# Patient Record
Sex: Female | Born: 1980
Health system: Southern US, Community
[De-identification: ages and names within clinical notes are randomized; demographics above are authoritative.]

## PROBLEM LIST (undated history)

## (undated) DIAGNOSIS — J45909 Unspecified asthma, uncomplicated: Secondary | ICD-10-CM

## (undated) DIAGNOSIS — Z789 Other specified health status: Secondary | ICD-10-CM

## (undated) DIAGNOSIS — K219 Gastro-esophageal reflux disease without esophagitis: Secondary | ICD-10-CM

## (undated) DIAGNOSIS — F419 Anxiety disorder, unspecified: Secondary | ICD-10-CM

## (undated) DIAGNOSIS — F909 Attention-deficit hyperactivity disorder, unspecified type: Secondary | ICD-10-CM

## (undated) DIAGNOSIS — G43909 Migraine, unspecified, not intractable, without status migrainosus: Secondary | ICD-10-CM

## (undated) DIAGNOSIS — D649 Anemia, unspecified: Secondary | ICD-10-CM

## (undated) DIAGNOSIS — N390 Urinary tract infection, site not specified: Secondary | ICD-10-CM

## (undated) HISTORY — DX: Unspecified asthma, uncomplicated: J45.909

## (undated) HISTORY — DX: Attention-deficit hyperactivity disorder, unspecified type: F90.9

## (undated) HISTORY — DX: Anxiety disorder, unspecified: F41.9

## (undated) HISTORY — DX: Anemia, unspecified: D64.9

## (undated) HISTORY — PX: INTRAUTERINE DEVICE INSERTION: SHX323

## (undated) HISTORY — DX: Migraine, unspecified, not intractable, without status migrainosus: G43.909

## (undated) HISTORY — DX: Gastro-esophageal reflux disease without esophagitis: K21.9

## (undated) HISTORY — DX: Urinary tract infection, site not specified: N39.0

---

## 2007-11-06 ENCOUNTER — Inpatient Hospital Stay (HOSPITAL_COMMUNITY): Admission: AD | Admit: 2007-11-06 | Discharge: 2007-11-10 | Payer: Self-pay | Admitting: Obstetrics and Gynecology

## 2010-03-08 ENCOUNTER — Inpatient Hospital Stay (HOSPITAL_COMMUNITY)
Admission: AD | Admit: 2010-03-08 | Discharge: 2010-03-08 | Disposition: A | Discharge status: Home / Self Care | Payer: BC Managed Care – PPO | Source: Ambulatory Visit | Attending: Obstetrics and Gynecology | Admitting: Obstetrics and Gynecology

## 2010-03-08 DIAGNOSIS — O210 Mild hyperemesis gravidarum: Secondary | ICD-10-CM | POA: Insufficient documentation

## 2010-03-08 LAB — ABO/RH: RH Type: POSITIVE

## 2010-03-08 LAB — ANTIBODY SCREEN: Antibody Screen: NEGATIVE

## 2010-03-08 LAB — RPR: RPR: NONREACTIVE

## 2010-03-08 LAB — HIV ANTIBODY (ROUTINE TESTING W REFLEX): HIV: NONREACTIVE

## 2010-03-08 LAB — RUBELLA ANTIBODY, IGM: Rubella: IMMUNE

## 2010-03-08 LAB — HEPATITIS B SURFACE ANTIGEN: Hepatitis B Surface Ag: NEGATIVE

## 2010-06-01 NOTE — Op Note (Signed)
NAMESEVERINA, Gina Cantrell         ACCOUNT NO.:  [id number]   MEDICAL RECORD NO.:  [id number]          PATIENT TYPE:  INP   LOCATION:  9123                          FACILITY:  [facility]   PHYSICIAN:  Michelle L. Grewal, M.D.DATE OF BIRTH:  July 04, 1980   DATE OF PROCEDURE:  11/07/2007  DATE OF DISCHARGE:                               OPERATIVE REPORT   PREOPERATIVE DIAGNOSES:  Intrauterine pregnancy at 39-6/7, large for  gestational age, and nonreassuring fetal heart rate.   POSTOPERATIVE DIAGNOSES:  Intrauterine pregnancy at 39-6/7, large for  gestational age, and nonreassuring fetal heart rate.   PROCEDURE:  Primary low transverse cesarean section.   SURGEON:  Michelle L. Vincente Poli, MD   ANESTHESIA:  Epidural.   FINDINGS:  Female infant, Apgars 8 at 1 and 9 at 5 minutes.   ESTIMATED BLOOD LOSS:  500 mL.   COMPLICATIONS:  None.   PROCEDURE IN DETAIL:  The patient was taken to the operating room from  labor and delivery room #168.  She was prepped and draped.  Foley  catheter had been inserted while on labor and delivery.  A low  transverse incision was made, carried down to the fascia, fascia scored  in the midline, and extended laterally.  Rectus muscles were separated  in the midline.  The peritoneum was entered bluntly.  The peritoneal  incision was then stretched.  The bladder blade was inserted.  The lower  uterine segment was identified and the bladder flap was created sharply  and then digitally.  The bladder blade was readjusted.  A low transverse  incision was made in the uterus.  The baby was noted to be in OA  position, but the head was hyperextended.  I think this is why the baby  was not coming down.  A vacuum extractor was placed in the proper place,  and the baby was delivered easily with 2 gentle pulls.  The baby was a  female infant, Apgars 8 at 1 minute and 9 at 5 minutes.  The baby was  DeLee on the abdomen, but the fluid was clear.  After cord was clamped  and  cut, the baby was handed to the Awaiting Neonatal Team.  The cord pH  were unable to obtain and a cord pH with the placenta was manually  removed and noted to be normal intact with a three-vessel cord.  The  uterus was exteriorized.  It was cleared of all clots and debris.  The  ovaries appeared normal.  This uterine incision was closed in 1 layer  using 0 chromic in a running locked stitch.  The uterus was returned to  the abdomen.  Irrigation was performed.  Rectus muscles and peritoneum  were reapproximated using 0 Vicryl.  The  fascia was closed using 0 Vicryl in a running stitch.  After irrigation  of subcutaneous layer, the skin was closed with subcuticular using 3-0  Vicryl and then closed with Dermabond skin adhesive.  All sponge, laps,  and instrument counts were correct x2.  The patient went to recovery  room in stable condition.      Michelle L. Vincente Poli, M.D.  Electronically Signed     MLG/MEDQ  D:  11/07/2007  T:  11/08/2007  Job:  [id number]

## 2010-06-04 NOTE — Discharge Summary (Signed)
Gina Cantrell, Gina Cantrell         ACCOUNT NO.:  [id number]   MEDICAL RECORD NO.:  [id number]          PATIENT TYPE:  INP   LOCATION:  9123                          FACILITY:  [facility]   PHYSICIAN:  Dineen Kid. Rana Snare, M.D.    DATE OF BIRTH:  June 24, 1980   DATE OF ADMISSION:  11/06/2007  DATE OF DISCHARGE:  11/10/2007                               DISCHARGE SUMMARY   ADMITTING DIAGNOSES:  1. Intrauterine pregnancy 39 and 6/7th weeks' estimated gestational      age.  2. Induction of labor secondary to large for gestational age infant.   DISCHARGE DIAGNOSES:  1. Status post low transverse cesarean section secondary to      nonreassuring fetal heart tones.  2. A viable female infant.   PROCEDURE:  Primary low transverse cesarean section.   REASON FOR ADMISSION:  Please see written H&P.   HOSPITAL COURSE:  The patient is a 30 year old gravida 2, para 0, that  was admitted to [facility] at 60 and 6/7th weeks'  estimated gestational age for induction of labor.  The patient had known  large for gestational age infant with estimated fetal weight of 8.5  pounds, which placed the patient at 80th percentile for estimated fetal  weight.  The patient was now admitted for induction of labor.  Prenatal  care had been uncomplicated.  Group B beta strep culture was negative.  On admission, vital signs were stable.  She was afebrile.  Fetal heart  tones were reactive.  Cervix was examined and found to be 1.5 cm  dilated, 80% effaced, vertex at a zero station.  Artificial rupture of  membranes was performed, which revealed some watery dark meconium-  stained fluid.  The patient was started on amnioinfusion and epidural  was placed for comfort.  Shortly, thereafter the fetal heart tones were  noted to have repetitive decelerations, which appeared to be late in  appearance.  Decision was made then to proceed with a primary low  transverse cesarean section for nonreassuring fetal heart tones  and  failure to progress.  The patient was then transferred to the operating  room where epidural was dosed to an adequate surgical level.  A low  transverse incision was made with delivery of a viable female infant  weighing 6 pounds 7 ounces with Apgars of 8 at one minute and 9 at five  minutes.  The patient tolerated the procedure well and was taken to the  recovery room in stable condition.  On postoperative day #1, the patient  was without complaint other than some incisional soreness.  Vital signs  were stable.  She was afebrile.  Abdomen was soft with good return of  bowel function.  Fundus was firm, slightly tender to palpation.  Incision was noted to be clean, dry, and intact with subcuticular  closure, some ecchymosis was noted superior and inferior to the  incisional site.  Laboratory findings showed a hemoglobin of 10.2,  platelet count 180,000, WBC of 10.8.  On postoperative day #2, the  patient continued to have some incisional soreness.  Vital signs were  otherwise stable.  Abdomen was soft.  Fundus was firm, nontender.  Incision was clean, dry, and intact and ecchymosis continued to be  superior and inferior to the incisional site, however stable.  On  postoperative day #3, the patient was without complaint.  Vital signs  remained stable.  She was afebrile.  Fundus was firm, nontender.  Incision was clean, dry, and intact.  Ecchymosis was without change.   DISCHARGE INSTRUCTIONS:  Reviewed and the patient was later discharged  home.   CONDITION ON DISCHARGE:  Stable.   DIET:  Regular as tolerated.   ACTIVITY:  No heavy lifting, no driving x2 weeks, and no vaginal entry.   FOLLOWUP:  The patient is to follow up in the office in 1-2 weeks for an  incision check.  She is to call for temperature greater than 100  degrees, persistent nausea, vomiting, heavy vaginal bleeding and/or  redness, or drainage from the incisional site.   DISCHARGE MEDICATIONS:  1. Tylox #30, 1  p.o. for every 4-6 hours p.r.n.  2. Motrin 600 mg every 6 hours.  3. Prenatal vitamins 1 p.o. daily.  4. Colace 1 p.o. daily p.r.n.      Julio Sicks, N.P.      Dineen Kid Rana Snare, M.D.  Electronically Signed    CC/MEDQ  D:  12/10/2007  T:  12/10/2007  Job:  [id number]

## 2010-10-13 ENCOUNTER — Encounter (HOSPITAL_COMMUNITY)
Admission: RE | Admit: 2010-10-13 | Discharge: 2010-10-13 | Disposition: A | Discharge status: Home / Self Care | Payer: BC Managed Care – PPO | Source: Ambulatory Visit | Attending: Obstetrics and Gynecology | Admitting: Obstetrics and Gynecology

## 2010-10-13 ENCOUNTER — Encounter (HOSPITAL_COMMUNITY): Payer: Self-pay

## 2010-10-13 HISTORY — DX: Other specified health status: Z78.9

## 2010-10-13 LAB — CBC
HCT: 32 % — ABNORMAL LOW (ref 36.0–46.0)
Hemoglobin: 10.6 g/dL — ABNORMAL LOW (ref 12.0–15.0)
MCH: 28.1 pg (ref 26.0–34.0)
MCHC: 33.1 g/dL (ref 30.0–36.0)
MCV: 84.9 fL (ref 78.0–100.0)
Platelets: 208 10*3/uL (ref 150–400)
RBC: 3.77 MIL/uL — ABNORMAL LOW (ref 3.87–5.11)
RDW: 12.6 % (ref 11.5–15.5)
WBC: 7.7 10*3/uL (ref 4.0–10.5)

## 2010-10-13 LAB — RPR: RPR Ser Ql: NONREACTIVE

## 2010-10-13 LAB — SURGICAL PCR SCREEN
MRSA, PCR: NEGATIVE
Staphylococcus aureus: NEGATIVE

## 2010-10-13 NOTE — Patient Instructions (Signed)
   Your procedure is scheduled on:10/18/10- Monday  Enter through the Main Entrance of [facility] at:0600 Pick up the phone at the desk and dial [phone] and inform us of your arrival  Please call this number if you have any problems the morning of surgery: [phone]  Remember: Do not eat food after midnight  Do not drink clear liquids after:midnight Take these medicines the morning of surgery with a SIP OF WATER:none  Do not wear jewelry, make-up, or FINGER nail polish Do not wear lotions, powders, or perfumes.  You may wear deodorant. Do not shave 48 hours prior to surgery. Do not bring valuables to the hospital. Leave suitcase in the car. After Surgery it may be brought to your room. For patients being admitted to the hospital, checkout time is 11:00am the day of discharge.  Patients discharged on the day of surgery will not be allowed to drive home.   Name and phone number of your driver:   Remember to use your hibiclens as instructed.Please shower with 1/2 bottle the evening before your surgery and the other 1/2 bottle the morning of surgery.

## 2010-10-17 NOTE — H&P (Signed)
30 year old G 2 P 2 at 40 weeks and 1 day admitted for Repeat c section on October 1. She has had 1 previous c section.  pnc see hollister  gbbs is negative  AFVSS Gen alert and oriented Lung CTAB Car RRR Abd gravid Cervix is closed No edema  IMP IUP at term Previous c section  PLAN Repeat C Section

## 2010-10-18 ENCOUNTER — Encounter (HOSPITAL_COMMUNITY)
Admission: RE | Disposition: A | Discharge status: Home / Self Care | Payer: Self-pay | Source: Ambulatory Visit | Attending: Obstetrics and Gynecology

## 2010-10-18 ENCOUNTER — Encounter (HOSPITAL_COMMUNITY): Payer: Self-pay | Admitting: *Deleted

## 2010-10-18 ENCOUNTER — Encounter (HOSPITAL_COMMUNITY): Payer: Self-pay | Admitting: Anesthesiology

## 2010-10-18 ENCOUNTER — Inpatient Hospital Stay (HOSPITAL_COMMUNITY)
Admission: RE | Admit: 2010-10-18 | Discharge: 2010-10-21 | DRG: 371 | Disposition: A | Discharge status: Home / Self Care | Payer: BC Managed Care – PPO | Source: Ambulatory Visit | Attending: Obstetrics and Gynecology | Admitting: Obstetrics and Gynecology

## 2010-10-18 ENCOUNTER — Inpatient Hospital Stay (HOSPITAL_COMMUNITY): Payer: BC Managed Care – PPO | Admitting: Anesthesiology

## 2010-10-18 DIAGNOSIS — Z01812 Encounter for preprocedural laboratory examination: Secondary | ICD-10-CM

## 2010-10-18 DIAGNOSIS — Z98891 History of uterine scar from previous surgery: Secondary | ICD-10-CM

## 2010-10-18 DIAGNOSIS — Z01818 Encounter for other preprocedural examination: Secondary | ICD-10-CM

## 2010-10-18 DIAGNOSIS — O34219 Maternal care for unspecified type scar from previous cesarean delivery: Principal | ICD-10-CM | POA: Diagnosis present

## 2010-10-18 SURGERY — Surgical Case
Anesthesia: Choice

## 2010-10-18 MED ORDER — TETANUS-DIPHTH-ACELL PERTUSSIS 5-2.5-18.5 LF-MCG/0.5 IM SUSP: 0.5000 mL | Freq: Once | INTRAMUSCULAR | Status: DC

## 2010-10-18 MED ORDER — DIPHENHYDRAMINE HCL 50 MG/ML IJ SOLN: 25.0000 mg | INTRAMUSCULAR | Status: DC | PRN

## 2010-10-18 MED ORDER — LANOLIN HYDROUS EX OINT: 1.0000 "application " | TOPICAL_OINTMENT | CUTANEOUS | Status: DC | PRN

## 2010-10-18 MED ORDER — MEDROXYPROGESTERONE ACETATE 150 MG/ML IM SUSP: 150.0000 mg | INTRAMUSCULAR | Status: DC | PRN

## 2010-10-18 MED ORDER — ONDANSETRON HCL 4 MG PO TABS: 4.0000 mg | ORAL_TABLET | ORAL | Status: DC | PRN

## 2010-10-18 MED ORDER — SIMETHICONE 80 MG PO CHEW
80.0000 mg | CHEWABLE_TABLET | Freq: Three times a day (TID) | ORAL | Status: DC
  Administered 2010-10-18 – 2010-10-20 (×11): 80 mg via ORAL

## 2010-10-18 MED ORDER — SCOPOLAMINE 1 MG/3DAYS TD PT72
MEDICATED_PATCH | TRANSDERMAL | Status: AC
  Administered 2010-10-18: 1.5 mg
  Filled 2010-10-18: qty 1

## 2010-10-18 MED ORDER — OXYTOCIN 10 UNIT/ML IJ SOLN
INTRAMUSCULAR | Status: AC
  Filled 2010-10-18: qty 2

## 2010-10-18 MED ORDER — INFLUENZA VIRUS VACC SPLIT PF IM SUSP
0.5000 mL | Freq: Once | INTRAMUSCULAR | Status: AC
  Administered 2010-10-19: 0.5 mL via INTRAMUSCULAR
  Filled 2010-10-18: qty 0.5

## 2010-10-18 MED ORDER — FLEET ENEMA 7-19 GM/118ML RE ENEM: 1.0000 | ENEMA | RECTAL | Status: DC | PRN

## 2010-10-18 MED ORDER — EPHEDRINE 5 MG/ML INJ
INTRAVENOUS | Status: AC
  Filled 2010-10-18: qty 10

## 2010-10-18 MED ORDER — MORPHINE SULFATE 0.5 MG/ML IJ SOLN
INTRAMUSCULAR | Status: AC
  Filled 2010-10-18: qty 10

## 2010-10-18 MED ORDER — SIMETHICONE 80 MG PO CHEW: 80.0000 mg | CHEWABLE_TABLET | ORAL | Status: DC | PRN

## 2010-10-18 MED ORDER — METOCLOPRAMIDE HCL 5 MG/ML IJ SOLN: 10.0000 mg | Freq: Three times a day (TID) | INTRAMUSCULAR | Status: DC | PRN

## 2010-10-18 MED ORDER — PRENATAL PLUS 27-1 MG PO TABS
1.0000 | ORAL_TABLET | Freq: Every day | ORAL | Status: DC
  Administered 2010-10-19 – 2010-10-20 (×2): 1 via ORAL
  Filled 2010-10-18 (×2): qty 1

## 2010-10-18 MED ORDER — WITCH HAZEL-GLYCERIN EX PADS: 1.0000 "application " | MEDICATED_PAD | CUTANEOUS | Status: DC | PRN

## 2010-10-18 MED ORDER — IBUPROFEN 600 MG PO TABS
600.0000 mg | ORAL_TABLET | Freq: Four times a day (QID) | ORAL | Status: DC
  Administered 2010-10-18 – 2010-10-21 (×11): 600 mg via ORAL
  Filled 2010-10-18 (×5): qty 1

## 2010-10-18 MED ORDER — KETOROLAC TROMETHAMINE 30 MG/ML IJ SOLN: 30.0000 mg | Freq: Four times a day (QID) | INTRAMUSCULAR | Status: AC | PRN

## 2010-10-18 MED ORDER — ONDANSETRON HCL 4 MG/2ML IJ SOLN: 4.0000 mg | Freq: Three times a day (TID) | INTRAMUSCULAR | Status: DC | PRN

## 2010-10-18 MED ORDER — SCOPOLAMINE 1 MG/3DAYS TD PT72
1.0000 | MEDICATED_PATCH | Freq: Once | TRANSDERMAL | Status: DC
  Filled 2010-10-18: qty 1

## 2010-10-18 MED ORDER — PHENYLEPHRINE HCL 10 MG/ML IJ SOLN
INTRAMUSCULAR | Status: DC | PRN
  Administered 2010-10-18: 40 ug via INTRAVENOUS
  Administered 2010-10-18 (×3): 80 ug via INTRAVENOUS

## 2010-10-18 MED ORDER — ACETAMINOPHEN 10 MG/ML IV SOLN
1000.0000 mg | Freq: Four times a day (QID) | INTRAVENOUS | Status: AC | PRN
  Filled 2010-10-18: qty 100

## 2010-10-18 MED ORDER — OXYTOCIN 20 UNITS IN LACTATED RINGERS INFUSION - SIMPLE: 125.0000 mL/h | INTRAVENOUS | Status: AC

## 2010-10-18 MED ORDER — LACTATED RINGERS IV SOLN
INTRAVENOUS | Status: DC
  Administered 2010-10-18: 07:00:00 via INTRAVENOUS

## 2010-10-18 MED ORDER — SODIUM CHLORIDE 0.9 % IJ SOLN: 3.0000 mL | INTRAMUSCULAR | Status: DC | PRN

## 2010-10-18 MED ORDER — SENNOSIDES-DOCUSATE SODIUM 8.6-50 MG PO TABS
2.0000 | ORAL_TABLET | Freq: Every day | ORAL | Status: DC
  Administered 2010-10-18 – 2010-10-20 (×3): 2 via ORAL

## 2010-10-18 MED ORDER — CEFAZOLIN SODIUM 1-5 GM-% IV SOLN
INTRAVENOUS | Status: DC | PRN
  Administered 2010-10-18: 1 g via INTRAVENOUS

## 2010-10-18 MED ORDER — SODIUM CHLORIDE 0.9 % IV SOLN
1.0000 ug/kg/h | INTRAVENOUS | Status: DC | PRN
  Filled 2010-10-18: qty 2.5

## 2010-10-18 MED ORDER — BUPIVACAINE HCL (PF) 0.25 % IJ SOLN
INTRAMUSCULAR | Status: DC | PRN
  Administered 2010-10-18: 10 mL

## 2010-10-18 MED ORDER — OXYCODONE-ACETAMINOPHEN 5-325 MG PO TABS
1.0000 | ORAL_TABLET | ORAL | Status: DC | PRN
  Administered 2010-10-18 – 2010-10-21 (×7): 1 via ORAL
  Filled 2010-10-18: qty 2
  Filled 2010-10-18 (×6): qty 1

## 2010-10-18 MED ORDER — ONDANSETRON HCL 4 MG/2ML IJ SOLN: 4.0000 mg | INTRAMUSCULAR | Status: DC | PRN

## 2010-10-18 MED ORDER — ACETAMINOPHEN 325 MG PO TABS: 325.0000 mg | ORAL_TABLET | ORAL | Status: DC | PRN

## 2010-10-18 MED ORDER — CEFAZOLIN SODIUM 1-5 GM-% IV SOLN
INTRAVENOUS | Status: AC
  Filled 2010-10-18: qty 50

## 2010-10-18 MED ORDER — OXYTOCIN 10 UNIT/ML IJ SOLN
INTRAMUSCULAR | Status: DC | PRN
  Administered 2010-10-18: 20 [IU] via INTRAMUSCULAR
  Administered 2010-10-18: 10 [IU]

## 2010-10-18 MED ORDER — MENTHOL 3 MG MT LOZG: 1.0000 | LOZENGE | OROMUCOSAL | Status: DC | PRN

## 2010-10-18 MED ORDER — ONDANSETRON HCL 4 MG/2ML IJ SOLN
INTRAMUSCULAR | Status: AC
  Filled 2010-10-18: qty 2

## 2010-10-18 MED ORDER — DIBUCAINE 1 % RE OINT: 1.0000 "application " | TOPICAL_OINTMENT | RECTAL | Status: DC | PRN

## 2010-10-18 MED ORDER — BISACODYL 10 MG RE SUPP
10.0000 mg | Freq: Every day | RECTAL | Status: DC | PRN
  Administered 2010-10-19 – 2010-10-20 (×2): 10 mg via RECTAL
  Filled 2010-10-18 (×2): qty 1

## 2010-10-18 MED ORDER — MEPERIDINE HCL 25 MG/ML IJ SOLN
INTRAMUSCULAR | Status: AC
  Filled 2010-10-18: qty 1

## 2010-10-18 MED ORDER — CEFAZOLIN SODIUM 1-5 GM-% IV SOLN: 1.0000 g | INTRAVENOUS | Status: DC

## 2010-10-18 MED ORDER — LACTATED RINGERS IV SOLN
INTRAVENOUS | Status: DC
  Administered 2010-10-18: 17:00:00 via INTRAVENOUS

## 2010-10-18 MED ORDER — NALBUPHINE HCL 10 MG/ML IJ SOLN
5.0000 mg | INTRAMUSCULAR | Status: DC | PRN
  Filled 2010-10-18: qty 1

## 2010-10-18 MED ORDER — NALOXONE HCL 0.4 MG/ML IJ SOLN: 0.4000 mg | INTRAMUSCULAR | Status: DC | PRN

## 2010-10-18 MED ORDER — IBUPROFEN 600 MG PO TABS
600.0000 mg | ORAL_TABLET | Freq: Four times a day (QID) | ORAL | Status: DC | PRN
  Filled 2010-10-18 (×6): qty 1

## 2010-10-18 MED ORDER — PHENYLEPHRINE 40 MCG/ML (10ML) SYRINGE FOR IV PUSH (FOR BLOOD PRESSURE SUPPORT)
PREFILLED_SYRINGE | INTRAVENOUS | Status: AC
  Filled 2010-10-18: qty 15

## 2010-10-18 MED ORDER — MEPERIDINE HCL 25 MG/ML IJ SOLN: 6.2500 mg | INTRAMUSCULAR | Status: DC | PRN

## 2010-10-18 MED ORDER — ONDANSETRON HCL 4 MG/2ML IJ SOLN
INTRAMUSCULAR | Status: DC | PRN
  Administered 2010-10-18: 4 mg via INTRAVENOUS

## 2010-10-18 MED ORDER — EPHEDRINE SULFATE 50 MG/ML IJ SOLN
INTRAMUSCULAR | Status: DC | PRN
  Administered 2010-10-18: 10 mg via INTRAVENOUS

## 2010-10-18 MED ORDER — PROMETHAZINE HCL 25 MG/ML IJ SOLN: 6.2500 mg | INTRAMUSCULAR | Status: DC | PRN

## 2010-10-18 MED ORDER — KETOROLAC TROMETHAMINE 60 MG/2ML IM SOLN
INTRAMUSCULAR | Status: AC
  Administered 2010-10-18: 60 mg via INTRAMUSCULAR
  Filled 2010-10-18: qty 2

## 2010-10-18 MED ORDER — FENTANYL CITRATE 0.05 MG/ML IJ SOLN: 25.0000 ug | INTRAMUSCULAR | Status: DC | PRN

## 2010-10-18 MED ORDER — DIPHENHYDRAMINE HCL 25 MG PO CAPS: 25.0000 mg | ORAL_CAPSULE | Freq: Four times a day (QID) | ORAL | Status: DC | PRN

## 2010-10-18 MED ORDER — KETOROLAC TROMETHAMINE 30 MG/ML IJ SOLN: 15.0000 mg | Freq: Once | INTRAMUSCULAR | Status: DC | PRN

## 2010-10-18 MED ORDER — MEASLES, MUMPS & RUBELLA VAC ~~LOC~~ INJ
0.5000 mL | INJECTION | Freq: Once | SUBCUTANEOUS | Status: DC
  Filled 2010-10-18: qty 0.5

## 2010-10-18 MED ORDER — FENTANYL CITRATE 0.05 MG/ML IJ SOLN
INTRAMUSCULAR | Status: AC
  Filled 2010-10-18: qty 2

## 2010-10-18 MED ORDER — DIPHENHYDRAMINE HCL 25 MG PO CAPS: 25.0000 mg | ORAL_CAPSULE | ORAL | Status: DC | PRN

## 2010-10-18 MED ORDER — DIPHENHYDRAMINE HCL 50 MG/ML IJ SOLN: 12.5000 mg | INTRAMUSCULAR | Status: DC | PRN

## 2010-10-18 MED ORDER — ZOLPIDEM TARTRATE 5 MG PO TABS: 5.0000 mg | ORAL_TABLET | Freq: Every evening | ORAL | Status: DC | PRN

## 2010-10-18 SURGICAL SUPPLY — 27 items
BARRIER ADHS 3X4 INTERCEED (GAUZE/BANDAGES/DRESSINGS) IMPLANT
CHLORAPREP W/TINT 26ML (MISCELLANEOUS) ×2 IMPLANT
CLOTH BEACON ORANGE TIMEOUT ST (SAFETY) ×2 IMPLANT
CONTAINER PREFILL 10% NBF 15ML (MISCELLANEOUS) IMPLANT
ELECT REM PT RETURN 9FT ADLT (ELECTROSURGICAL) ×2
ELECTRODE REM PT RTRN 9FT ADLT (ELECTROSURGICAL) ×1 IMPLANT
EXTRACTOR VACUUM M CUP 4 TUBE (SUCTIONS) ×2 IMPLANT
GLOVE BIO SURGEON STRL SZ 6.5 (GLOVE) ×4 IMPLANT
GOWN PREVENTION PLUS LG XLONG (DISPOSABLE) ×6 IMPLANT
KIT ABG SYR 3ML LUER SLIP (SYRINGE) IMPLANT
NEEDLE HYPO 22GX1.5 SAFETY (NEEDLE) ×2 IMPLANT
NEEDLE HYPO 25X5/8 SAFETYGLIDE (NEEDLE) ×2 IMPLANT
NS IRRIG 1000ML POUR BTL (IV SOLUTION) ×2 IMPLANT
PACK C SECTION WH (CUSTOM PROCEDURE TRAY) ×2 IMPLANT
SEPRAFILM MEMBRANE 5X6 (MISCELLANEOUS) IMPLANT
SLEEVE SCD COMPRESS KNEE MED (MISCELLANEOUS) IMPLANT
STAPLER VISISTAT 35W (STAPLE) IMPLANT
SUT CHROMIC 0 CTX 36 (SUTURE) ×4 IMPLANT
SUT PLAIN 0 NONE (SUTURE) IMPLANT
SUT PLAIN 2 0 XLH (SUTURE) IMPLANT
SUT VIC AB 0 CT1 27 (SUTURE) ×3
SUT VIC AB 0 CT1 27XBRD ANBCTR (SUTURE) ×3 IMPLANT
SUT VIC AB 4-0 KS 27 (SUTURE) IMPLANT
SYR CONTROL 10ML LL (SYRINGE) ×2 IMPLANT
TOWEL OR 17X24 6PK STRL BLUE (TOWEL DISPOSABLE) ×4 IMPLANT
TRAY FOLEY CATH 14FR (SET/KITS/TRAYS/PACK) ×2 IMPLANT
WATER STERILE IRR 1000ML POUR (IV SOLUTION) ×2 IMPLANT

## 2010-10-18 NOTE — Progress Notes (Signed)
H and P on chart No significant changes Proceed with Repeat LTCS

## 2010-10-18 NOTE — Transfer of Care (Signed)
Immediate Anesthesia Transfer of Care Note  Patient: Gina Cantrell  Procedure(s) Performed:  CESAREAN SECTION - Repeat (request case for Avra,Wanda, Jermany Sundell Dr. Rodman Pickle))  Patient Location: PACU  Anesthesia Type: Spinal  Level of Consciousness: awake, alert  and oriented  Airway & Oxygen Therapy: Patient Spontanous Breathing  Post-op Assessment: Report given to PACU RN  Post vital signs: stable  Complications: No apparent anesthesia complications

## 2010-10-18 NOTE — Op Note (Signed)
Gina Cantrell, Gina Cantrell         ACCOUNT NO.:  [id number]  MEDICAL RECORD NO.:  [id number]  LOCATION:  9130                          FACILITY:  [facility]  PHYSICIAN:  Cyan Clippinger L. Lexa Coronado, M.D.DATE OF BIRTH:  06/27/1980  DATE OF PROCEDURE:  10/18/2010 DATE OF DISCHARGE:                              OPERATIVE REPORT   PREOPERATIVE DIAGNOSES: 1. Intrauterine pregnancy at 40 weeks and 1 day. 2. Previous cesarean section x1.  POSTOPERATIVE DIAGNOSES: 1. Intrauterine pregnancy at 40 weeks and 1 day. 2. Previous cesarean section x1.  PROCEDURE:  Repeat low transverse cesarean section.  SURGEON:  Palmer Fahrner L. Keilly Fatula, MD  ANESTHESIA:  Spinal.  ESTIMATED BLOOD LOSS:  Less than 500 mL.  Apgars 9 at one minute and 9 at five minutes, female infant in cephalic presentation.  PROCEDURE:  The patient was taken to the operating room where spinal was placed.  She was then prepped and draped in usual sterile fashion.  A Foley catheter was inserted.  A low transverse incision was made, carried down to the fascia.  Fascia was scored in the midline and extended laterally.  Rectus muscles were separated in the midline.  The peritoneum was entered bluntly.  The peritoneal incision was then stretched.  The bladder blade was inserted.  The lower uterine segment was identified and a low transverse incision was made in the uterus. Uterus was entered using hemostat.  The baby was in cephalic presentation, was delivered quite easily with a vacuum extractor.  The baby was a female infant, Apgars 9 at one minute and 9 at five minutes. The cord was clamped and cut.  The baby was handed to the awaiting pediatricians and the placenta was manually removed and noted to be normal intact with a three-vessel cord.  Cord blood had been obtained. Antibiotics and Pitocin were given.  The uterus was exteriorized and cleared of all clots and debris.  The uterine incision was closed using 0 chromic in a running locked  stitch.  The uterus was returned to the abdomen.  Irrigation was performed.  The peritoneum was closed using 0 Vicryl.  The rectus muscles were closed using 0 Vicryl.  The fascia was closed using 0 Vicryl in a running stitch.  After irrigation of the subcutaneous layer, the skin was closed with a 4-0 Vicryl on a Keith needle. Dermabond was applied.  Marcaine was administered.  All sponge, lap and instrument counts were correct x2.     Denita Lun L. Vincente Poli, M.D.     Florestine Avers  D:  10/18/2010  T:  10/18/2010  Job:  [id number]

## 2010-10-18 NOTE — Progress Notes (Signed)
Encounter addended by: Nicklas Mcsweeney, CRNA on: 10/18/2010  5:56 PM<BR>     Documentation filed: Notes Section

## 2010-10-18 NOTE — Anesthesia Postprocedure Evaluation (Signed)
  Anesthesia Post-op Note  Patient: Gina Cantrell  Procedure(s) Performed:  CESAREAN SECTION - Repeat (request case for Avra,Wanda, Colleen Dr. Rodman Pickle))   Patient is awake, responsive, moving her legs, and has signs of resolution of her numbness. Pain and nausea are reasonably well controlled. Vital signs are stable and clinically acceptable. Oxygen saturation is clinically acceptable. There are no apparent anesthetic complications at this time. Patient is ready for discharge.

## 2010-10-18 NOTE — Anesthesia Postprocedure Evaluation (Signed)
  Anesthesia Post-op Note  Patient: Gina Cantrell  Procedure(s) Performed:  CESAREAN SECTION - Repeat (request case for Avra,Wanda, Jessalynn Mccowan Dr. Rodman Pickle))  Patient Location: PACU and Mother/Baby  Anesthesia Type: Spinal  Level of Consciousness: awake, alert  and oriented  Airway and Oxygen Therapy: Patient Spontanous Breathing  Post-op Pain: none  Post-op Assessment: Post-op Vital signs reviewed  Post-op Vital Signs: stable  Complications: No apparent anesthesia complications

## 2010-10-18 NOTE — Consult Note (Signed)
Requested to attend repeat elective C/S at term gestation with no reported risk factors. Plan includes infant remaining with mother following delivery and assessment until her move to recovery room. OR nursing to care for infant during that time.  At delivery infant in vertex; vacuum assisted delivery with multiple pop offs. With exposure of infant spontaneous cry and fair tone.  Placed under radiant warmer, dried and bulb suction to nares and oropharynx with recovering tone and more active MAE.  No dysmorphic features noted. Infant did not void nor stool while during first five minutes of care and assessment..  Allowed to remain with mother and father as outlined above.   Care to assigned pediatrician.   Dagoberto Ligas MD [facility]

## 2010-10-18 NOTE — Brief Op Note (Signed)
10/18/2010  8:04 AM  PATIENT:  Marga Melnick  30 y.o. female  PRE-OPERATIVE DIAGNOSIS:  prev c/s x 1  POST-OPERATIVE DIAGNOSIS:  prev c/s  PROCEDURE:  Procedure(s): Repeat Low Transverse CESAREAN SECTION  SURGEON:  Surgeon(s): Jeani Hawking, MD  PHYSICIAN ASSISTANT:   ASSISTANTS: none   ANESTHESIA:   spinal  OR FLUID I/O:  Total I/O In: 2000 [I.V.:2000] Out: 1400 [Urine:800; Blood:600]  BLOOD ADMINISTERED:none  DRAINS: Urinary Catheter (Foley)   LOCAL MEDICATIONS USED:  MARCAINE 10 CC  SPECIMEN:  No Specimen  DISPOSITION OF SPECIMEN:  N/A  COUNTS:  YES  TOURNIQUET:  * No tourniquets in log *  DICTATION: .Other Dictation: Dictation Number [id number]  PLAN OF CARE: Admit to inpatient   PATIENT DISPOSITION:  PACU - hemodynamically stable.   Delay start of Pharmacological VTE agent (>24hrs) due to surgical blood loss or risk of bleeding:  not applicable

## 2010-10-18 NOTE — Anesthesia Preprocedure Evaluation (Signed)
Anesthesia Evaluation  Name, MR# and DOB Patient awake  General Assessment Comment  Reviewed: Allergy & Precautions, H&P , Patient's Chart, lab work & pertinent test results  Airway Mallampati: II TM Distance: >3 FB Neck ROM: full    Dental No notable dental hx.    Pulmonary  clear to auscultation  Pulmonary exam normal       Cardiovascular regular Normal    Neuro/Psych Negative Neurological ROS  Negative Psych ROS   GI/Hepatic negative GI ROS Neg liver ROS    Endo/Other  Negative Endocrine ROS  Renal/GU negative Renal ROS     Musculoskeletal   Abdominal   Peds  Hematology negative hematology ROS (+)   Anesthesia Other Findings   Reproductive/Obstetrics (+) Pregnancy                           Anesthesia Physical Anesthesia Plan  ASA: II  Anesthesia Plan: Spinal   Post-op Pain Management:    Induction:   Airway Management Planned:   Additional Equipment:   Intra-op Plan:   Post-operative Plan:   Informed Consent: I have reviewed the patients History and Physical, chart, labs and discussed the procedure including the risks, benefits and alternatives for the proposed anesthesia with the patient or authorized representative who has indicated his/her understanding and acceptance.     Plan Discussed with:   Anesthesia Plan Comments:         Anesthesia Quick Evaluation

## 2010-10-18 NOTE — Anesthesia Procedure Notes (Signed)
Spinal Block  Patient location during procedure: OR Staffing Anesthesiologist: Tahani Potier EDWARD Preanesthetic Checklist Completed: patient identified, site marked, surgical consent, pre-op evaluation, timeout performed, IV checked, risks and benefits discussed and monitors and equipment checked Spinal Block Patient position: sitting Prep: DuraPrep Patient monitoring: heart rate, cardiac monitor, continuous pulse ox and blood pressure Approach: midline Location: L3-4 Injection technique: single-shot Needle Needle type: Sprotte  Needle gauge: 24 G Needle length: 9 cm Assessment Sensory level: T4 Additional Notes Spinal Dosage in OR  Bupivicaine ml       1.6 PFMS04   mcg        150 Fentanyl mcg            20    

## 2010-10-19 ENCOUNTER — Encounter (HOSPITAL_COMMUNITY): Payer: Self-pay | Admitting: Obstetrics and Gynecology

## 2010-10-19 LAB — CBC
HCT: 28.6 % — ABNORMAL LOW (ref 36.0–46.0)
HCT: 30 — ABNORMAL LOW
HCT: 41.1
Hemoglobin: 9.4 g/dL — ABNORMAL LOW (ref 12.0–15.0)
Hemoglobin: 10.2 — ABNORMAL LOW
Hemoglobin: 14.1
MCH: 28.2 pg (ref 26.0–34.0)
MCHC: 32.9 g/dL (ref 30.0–36.0)
MCHC: 34
MCHC: 34.2
MCV: 85.9 fL (ref 78.0–100.0)
MCV: 86.7
MCV: 89.2
Platelets: 172 10*3/uL (ref 150–400)
Platelets: 180
Platelets: 235
RBC: 3.33 MIL/uL — ABNORMAL LOW (ref 3.87–5.11)
RBC: 3.36 — ABNORMAL LOW
RBC: 4.74
RDW: 12.6 % (ref 11.5–15.5)
RDW: 11.6
RDW: 12.2
WBC: 10.2 10*3/uL (ref 4.0–10.5)
WBC: 10.8 — ABNORMAL HIGH
WBC: 12 — ABNORMAL HIGH

## 2010-10-19 LAB — TYPE AND SCREEN
ABO/RH(D): O POS
Antibody Screen: NEGATIVE

## 2010-10-19 LAB — RUBELLA SCREEN: Rubella: 185.7 — ABNORMAL HIGH

## 2010-10-19 LAB — HEPATITIS B SURFACE ANTIGEN: Hepatitis B Surface Ag: NEGATIVE

## 2010-10-19 LAB — RPR: RPR Ser Ql: NONREACTIVE

## 2010-10-19 LAB — ABO/RH: ABO/RH(D): O POS

## 2010-10-19 LAB — RAPID HIV SCREEN (WH-MAU): Rapid HIV Screen: NONREACTIVE

## 2010-10-19 NOTE — Progress Notes (Signed)
Subjective: Postpartum Day 1: Cesarean Delivery Patient reports tolerating PO and no problems voiding.    Objective: Vital signs in last 24 hours: Temp:  [97.9 F (36.6 C)-99.7 F (37.6 C)] 97.9 F (36.6 C) (10/02 0430) Pulse Rate:  [56-74] 72  (10/02 0430) Resp:  [16-20] 18  (10/02 0430) BP: (95-113)/(53-68) 106/58 mmHg (10/02 0430) SpO2:  [95 %-100 %] 97 % (10/02 0430) Weight:  [72.576 kg (160 lb)] 160 lb (72.576 kg) (10/01 0951)  Physical Exam:  General: alert Lochia: appropriate Uterine Fundus: firm Incision: healing well, no significant drainage, no significant erythema DVT Evaluation: No evidence of DVT seen on physical exam.   Basename 10/19/10 0525  HGB 9.4*  HCT 28.6*    Assessment/Plan: Status post Cesarean section. Doing well postoperatively.  Continue current care.  Nikol Lemar 10/19/2010, 9:07 AM

## 2010-10-20 NOTE — Progress Notes (Signed)
POD #2 Passing some flatus after Dulcolax supp. No N/V , tolerating regular diet Voiding  VSS Afeb Abd mildly distended, BS + x 4 quads Incision edges well opposed, ecchymotic, no induration, no BR changes, no purulent D/C  A/P: dulcolax supp         Prob D/C in am

## 2010-10-21 MED ORDER — OXYCODONE-ACETAMINOPHEN 5-500 MG PO CAPS: 1.0000 | ORAL_CAPSULE | ORAL | Status: AC | PRN

## 2010-10-21 NOTE — Progress Notes (Signed)
Subjective: Postpartum Day 3: Cesarean Delivery Patient reports tolerating PO, + flatus and no problems voiding.    Objective: Vital signs in last 24 hours: Temp:  [98.8 F (37.1 C)-99.5 F (37.5 C)] 99.1 F (37.3 C) (07/14 0609) Pulse Rate:  [74-101] 74  (07/14 0609) Resp:  [18-20] 18  (07/14 0609) BP: (119-130)/(71-84) 119/71 mmHg (07/14 0609) SpO2:  [99 %] 99 % (07/13 1400)  Physical Exam:  General: alert, cooperative and no distress Lochia: appropriate Uterine Fundus: firm Incision: healing well DVT Evaluation: No evidence of DVT seen on physical exam.   Basename 07/30/10 0530 07/28/10 2030  HGB 9.6* 11.5*  HCT 27.3* 31.3*    Assessment/Plan: Status post Cesarean section. Doing well postoperatively.  Discharge home with standard precautions and return to clinic in 4-6 weeks.  Zamiyah Resendes C 07/31/2010, 10:23 AM

## 2010-10-25 NOTE — Discharge Summary (Signed)
Obstetric Discharge Summary Reason for Admission: cesarean section Prenatal Procedures: none Intrapartum Procedures: cesarean: low cervical, transverse Postpartum Procedures: none Complications-Operative and Postpartum: none Hemoglobin  Date Value Range Status  10/19/2010 9.4* 12.0-15.0 (g/dL) Final     HCT  Date Value Range Status  10/19/2010 28.6* 36.0-46.0 (%) Final    Discharge Diagnoses: Term Pregnancy-delivered  Discharge Information: Date: 10/25/2010 Activity: pelvic rest Diet: routine Medications: PNV, Ibuprofen and Percocet Condition: improved Instructions: refer to practice specific booklet Discharge to: home   Newborn Data: Live born female  Birth Weight: 6 lb 10.9 oz (3030 g) APGAR: 7, 9  Home with mother.  Miah Boye G 10/25/2010, 9:20 AM

## 2013-10-09 ENCOUNTER — Other Ambulatory Visit: Payer: Self-pay | Admitting: Family Medicine

## 2013-10-09 DIAGNOSIS — R109 Unspecified abdominal pain: Secondary | ICD-10-CM

## 2013-10-15 ENCOUNTER — Other Ambulatory Visit: Payer: BC Managed Care – PPO

## 2013-10-16 ENCOUNTER — Ambulatory Visit
Admission: RE | Admit: 2013-10-16 | Discharge: 2013-10-16 | Disposition: A | Discharge status: Home / Self Care | Payer: BC Managed Care – PPO | Source: Ambulatory Visit | Attending: Family Medicine | Admitting: Family Medicine

## 2013-10-16 DIAGNOSIS — R109 Unspecified abdominal pain: Secondary | ICD-10-CM

## 2013-11-18 ENCOUNTER — Encounter (HOSPITAL_COMMUNITY): Payer: Self-pay | Admitting: Obstetrics and Gynecology

## 2015-05-26 ENCOUNTER — Other Ambulatory Visit (HOSPITAL_COMMUNITY): Payer: Self-pay | Admitting: *Deleted

## 2015-05-26 DIAGNOSIS — Z136 Encounter for screening for cardiovascular disorders: Secondary | ICD-10-CM

## 2015-05-27 ENCOUNTER — Telehealth (HOSPITAL_COMMUNITY): Payer: Self-pay | Admitting: Vascular Surgery

## 2015-05-27 NOTE — Telephone Encounter (Signed)
LEFT MESSAGE TO MAKE NP APPT

## 2015-07-20 ENCOUNTER — Ambulatory Visit (HOSPITAL_COMMUNITY): Admission: RE | Admit: 2015-07-20 | Payer: 59 | Source: Ambulatory Visit

## 2015-07-20 ENCOUNTER — Ambulatory Visit (HOSPITAL_COMMUNITY): Payer: Self-pay | Admitting: Internal Medicine

## 2015-08-31 ENCOUNTER — Ambulatory Visit (HOSPITAL_COMMUNITY)
Admission: RE | Admit: 2015-08-31 | Discharge: 2015-08-31 | Disposition: A | Discharge status: Home / Self Care | Payer: 59 | Source: Ambulatory Visit | Attending: Internal Medicine | Admitting: Internal Medicine

## 2015-08-31 ENCOUNTER — Ambulatory Visit (HOSPITAL_BASED_OUTPATIENT_CLINIC_OR_DEPARTMENT_OTHER)
Admission: RE | Admit: 2015-08-31 | Discharge: 2015-08-31 | Disposition: A | Discharge status: Home / Self Care | Payer: 59 | Source: Ambulatory Visit | Attending: Internal Medicine | Admitting: Internal Medicine

## 2015-08-31 ENCOUNTER — Encounter (HOSPITAL_COMMUNITY): Payer: Self-pay | Admitting: Internal Medicine

## 2015-08-31 VITALS — BP 128/80 | HR 68 | Wt 118.5 lb

## 2015-08-31 DIAGNOSIS — Z8249 Family history of ischemic heart disease and other diseases of the circulatory system: Secondary | ICD-10-CM

## 2015-08-31 DIAGNOSIS — Z136 Encounter for screening for cardiovascular disorders: Secondary | ICD-10-CM

## 2015-08-31 DIAGNOSIS — I509 Heart failure, unspecified: Secondary | ICD-10-CM | POA: Diagnosis not present

## 2015-08-31 NOTE — Progress Notes (Signed)
Advanced Heart Failure Clinic Note   Referring Physician: Marcelle OverlieMichelle Grewal, MD (OBGYN) Primary Care: Gina RowanElizabeth Dewey, MD   Primary Cardiologist: Gina Cantrell:  Gina Cantrell, D-MD, MS is a 35 y.o. female with hx of ADHD who presents for screening for cardiomyopathy .  States her biological father ( in his 7370s) was diagnosed with HOCM approximately 3-4 months ago.  She is estranged from him, so is not sure of the circumstances surrounding his diagnosis.     Former smoker ( 1 ppd for 7 years). Drinks 1-2 glasses of wine every other day (doesn't drink anything else). No hx of drug use. Has 2 children. Has 2 biological siblings (one older and one twin). Older sister has had Echo and was clear.  Doesn't exercise. Denies palpitations, syncope CP, or DOE. Does have occasional GERD. No other known FHx of HCM or SCD.  Review of Systems: [y] = yes, [ ]  = no   General: Weight gain [ ] ; Weight loss [ ] ; Anorexia [ ] ; Fatigue [ ] ; Fever [ ] ; Chills [ ] ; Weakness [ ]   Cardiac: Chest pain/pressure [ ] ; Resting SOB [ ] ; Exertional SOB [ ] ; Orthopnea [ ] ; Pedal Edema [ ] ; Palpitations [ ] ; Syncope [ ] ; Presyncope [ ] ; Paroxysmal nocturnal dyspnea[ ]   Pulmonary: Cough [ ] ; Wheezing[ ] ; Hemoptysis[ ] ; Sputum [ ] ; Snoring [ ]   GI: Vomiting[ ] ; Dysphagia[ ] ; Melena[ ] ; Hematochezia [ ] ; Heartburn[ ] ; Abdominal pain [ ] ; Constipation [ ] ; Diarrhea [ ] ; BRBPR [ ]   GU: Hematuria[ ] ; Dysuria [ ] ; Nocturia[ ]   Vascular: Pain in legs with walking [ ] ; Pain in feet with lying flat [ ] ; Non-healing sores [ ] ; Stroke [ ] ; TIA [ ] ; Slurred speech [ ] ;  Neuro: Headaches[ ] ; Vertigo[ ] ; Seizures[ ] ; Paresthesias[ ] ;Blurred vision [ ] ; Diplopia [ ] ; Vision changes [ ]   Ortho/Skin: Arthritis [ ] ; Joint pain [ ] ; Muscle pain [ ] ; Joint swelling [ ] ; Back Pain [ ] ; Rash [ ]   Psych: Depression[ ] ; Anxiety[ ]   Heme: Bleeding problems [ ] ; Clotting disorders [ ] ; Anemia [ ]   Endocrine: Diabetes [ ] ; Thyroid dysfunction[  ]   Past Medical History:  Diagnosis Date  . No pertinent past medical history     Current Outpatient Prescriptions  Medication Sig Dispense Refill  . lisdexamfetamine (VYVANSE) 50 MG capsule Take 50 mg by mouth daily.     No current facility-administered medications for this encounter.     Allergies  Allergen Reactions  . Tobrex [Tobramycin Sulfate]     Eye inflammation      Social History   Social History  . Marital status: Married    Spouse name: N/A  . Number of children: N/A  . Years of education: N/A   Occupational History  . Not on file.   Social History Main Topics  . Smoking status: Former Smoker    Years: 7.00    Quit date: 08/31/2006  . Smokeless tobacco: Not on file  . Alcohol use No  . Drug use: No  . Sexual activity: Not on file   Other Topics Concern  . Not on file   Social History Narrative  . No narrative on file     No family history on file.  Vitals:   08/31/15 1403  BP: 128/80  Pulse: 68  SpO2: 100%  Weight: 118 lb 8 oz (53.8 kg)    PHYSICAL EXAM: General:  Well appearing. No respiratory difficulty HEENT: normal  Neck: supple. no JVD. Carotids 2+ bilat; no bruits. No lymphadenopathy or thyromegaly appreciated. Cor: PMI nondisplaced. Regular rate & rhythm. No rubs, gallops or murmurs. Lungs: CTAB, normal effort Abdomen: soft, NT, ND, no HSM. No bruits or masses. +BS  Extremities: no cyanosis, clubbing, rash, edema Neuro: alert & oriented x 3, cranial nerves grossly intact. moves all 4 extremities w/o difficulty. Affect pleasant.  ECG: NSR 70 bpm Norma axis and intervals. No LVH.   ASSESSMENT & PLAN:  1. Screening for cardiovascular condition; Biological father with "HCM" - Echo and EKG today normal. - Would like to get more information concerning her fathers diagnosis.  Possible that it is cardiomyopathy with similar Echo for her father, but may not be true "HCM"  Pt will attempt to get Echo and clinic note from [place]   Gina FreerMichael Andrew Tillery, PA-C 08/31/15   Patient seen and examined with Gina SaberAndy Tillery, PA-C. We discussed all aspects of the encounter. I agree with the assessment and plan as stated above.   Echo and ECG reviewed personally and no evidence of HCM. Prior to screening her on a regular basis and evaluating more of her family members would like to try and confirm her father's diagnosis of HCM as there does not seem to be significant family penetrance of this by history. Have asked her to try and obtain a copy of her father's echo for review, if possible.   Bensimhon, Daniel,MD 12:06 AM

## 2015-08-31 NOTE — Progress Notes (Signed)
  Echocardiogram 2D Echocardiogram has been performed.  Arvil ChacoFoster, Lataisha Colan 08/31/2015, 2:01 PM

## 2015-10-09 ENCOUNTER — Encounter (HOSPITAL_COMMUNITY): Payer: Self-pay

## 2015-10-09 NOTE — Progress Notes (Signed)
Medical Record Request sent from [facility] on 09/07/15 on behalf of mutual patient as a referral to our office from theirs. PN from her new patient appointment at our office faxed back to their office attn: Earna CoderKaren Talbert Referral Coordinator at provided fax # [phone]  Ave FilterBradley, Londen Bok Genevea, RN

## 2016-12-12 ENCOUNTER — Other Ambulatory Visit: Payer: Self-pay | Admitting: Family Medicine

## 2016-12-12 DIAGNOSIS — Z1231 Encounter for screening mammogram for malignant neoplasm of breast: Secondary | ICD-10-CM

## 2016-12-14 ENCOUNTER — Other Ambulatory Visit: Payer: Self-pay | Admitting: Plastic and Reconstructive Surgery

## 2016-12-14 DIAGNOSIS — Z1231 Encounter for screening mammogram for malignant neoplasm of breast: Secondary | ICD-10-CM

## 2017-01-09 ENCOUNTER — Encounter: Payer: Self-pay | Admitting: Radiology

## 2017-01-09 ENCOUNTER — Ambulatory Visit
Admission: RE | Admit: 2017-01-09 | Discharge: 2017-01-09 | Disposition: A | Discharge status: Home / Self Care | Payer: 59 | Source: Ambulatory Visit | Attending: Family Medicine | Admitting: Family Medicine

## 2017-01-09 DIAGNOSIS — Z1231 Encounter for screening mammogram for malignant neoplasm of breast: Secondary | ICD-10-CM

## 2018-08-30 ENCOUNTER — Other Ambulatory Visit: Payer: Self-pay

## 2018-08-30 DIAGNOSIS — Z20822 Contact with and (suspected) exposure to covid-19: Secondary | ICD-10-CM

## 2018-09-01 LAB — NOVEL CORONAVIRUS, NAA: SARS-CoV-2, NAA: NOT DETECTED

## 2018-09-01 LAB — SPECIMEN STATUS REPORT

## 2018-11-02 ENCOUNTER — Encounter: Payer: Self-pay | Admitting: Gastroenterology

## 2018-12-02 IMAGING — MG DIGITAL SCREENING BILATERAL MAMMOGRAM WITH CAD
4 series · 4 of 4 positions shown · non-contrast
Comparison: None.

CLINICAL DATA: Screening. Baseline.

EXAM:
DIGITAL SCREENING BILATERAL MAMMOGRAM WITH CAD

[L CC]
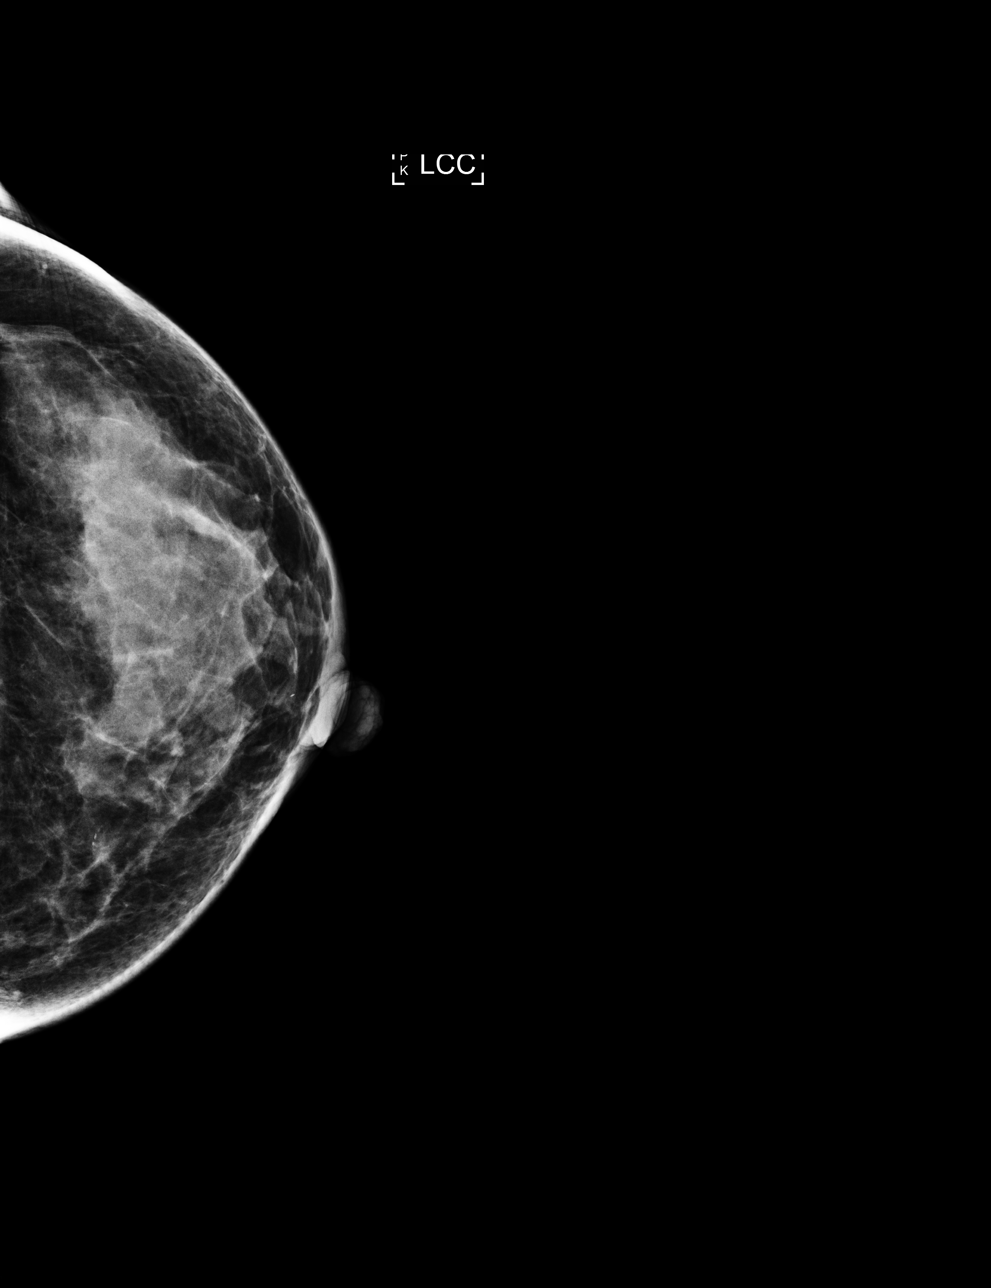

[L MLO]
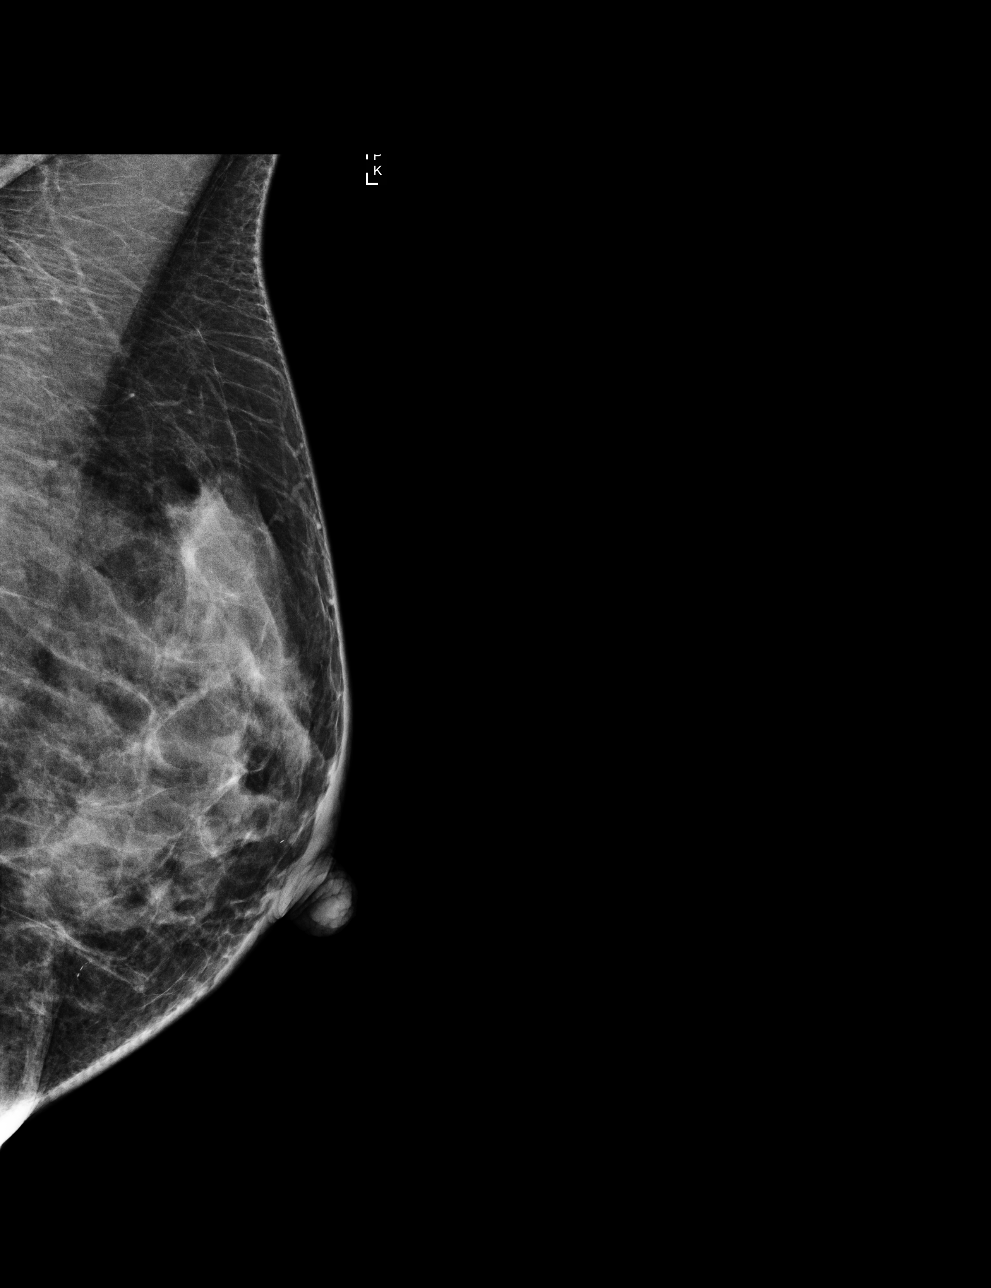

[R CC]
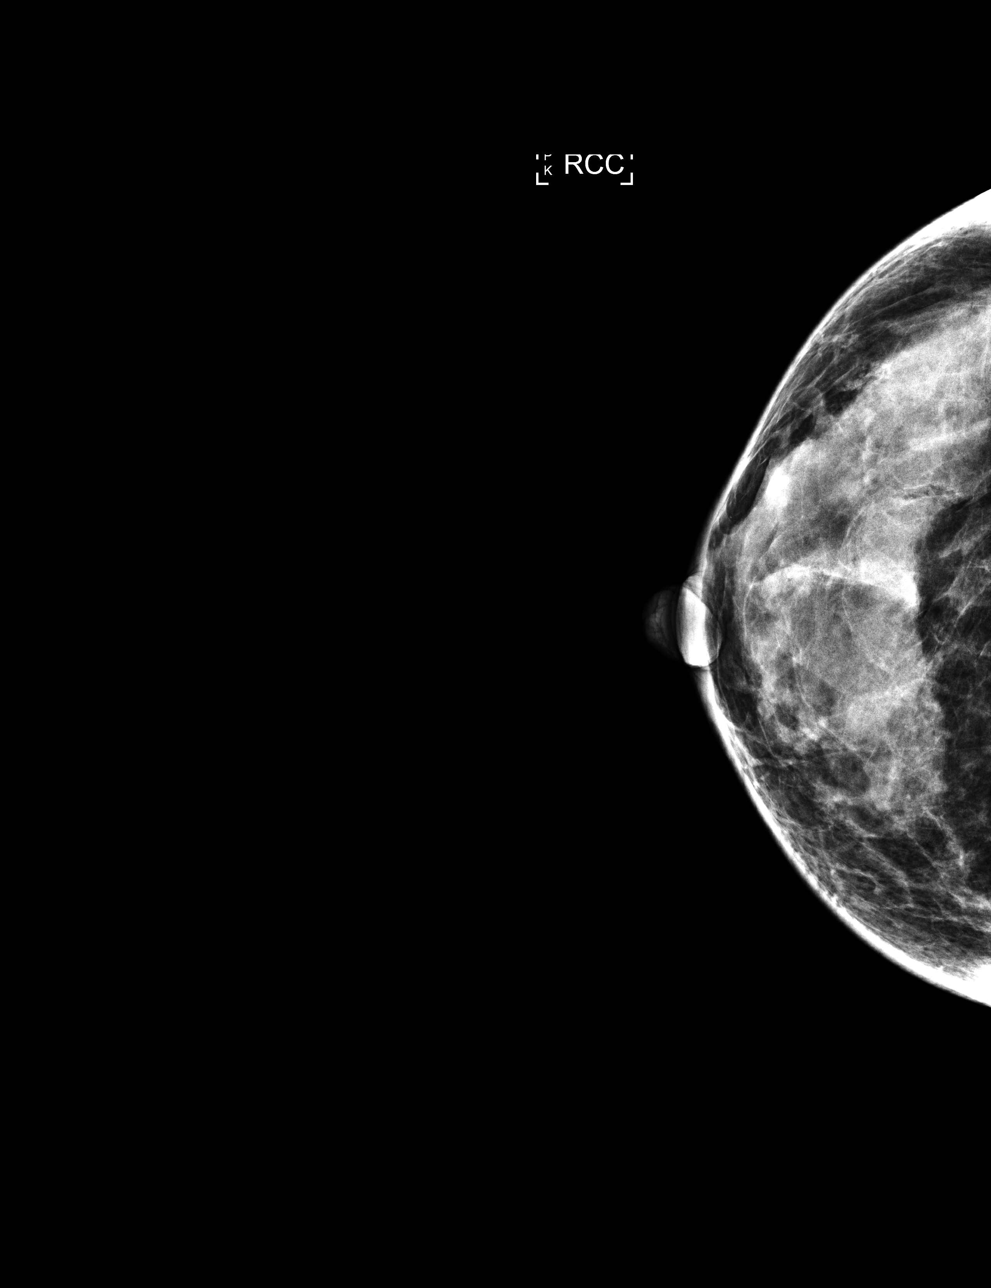

[R MLO]
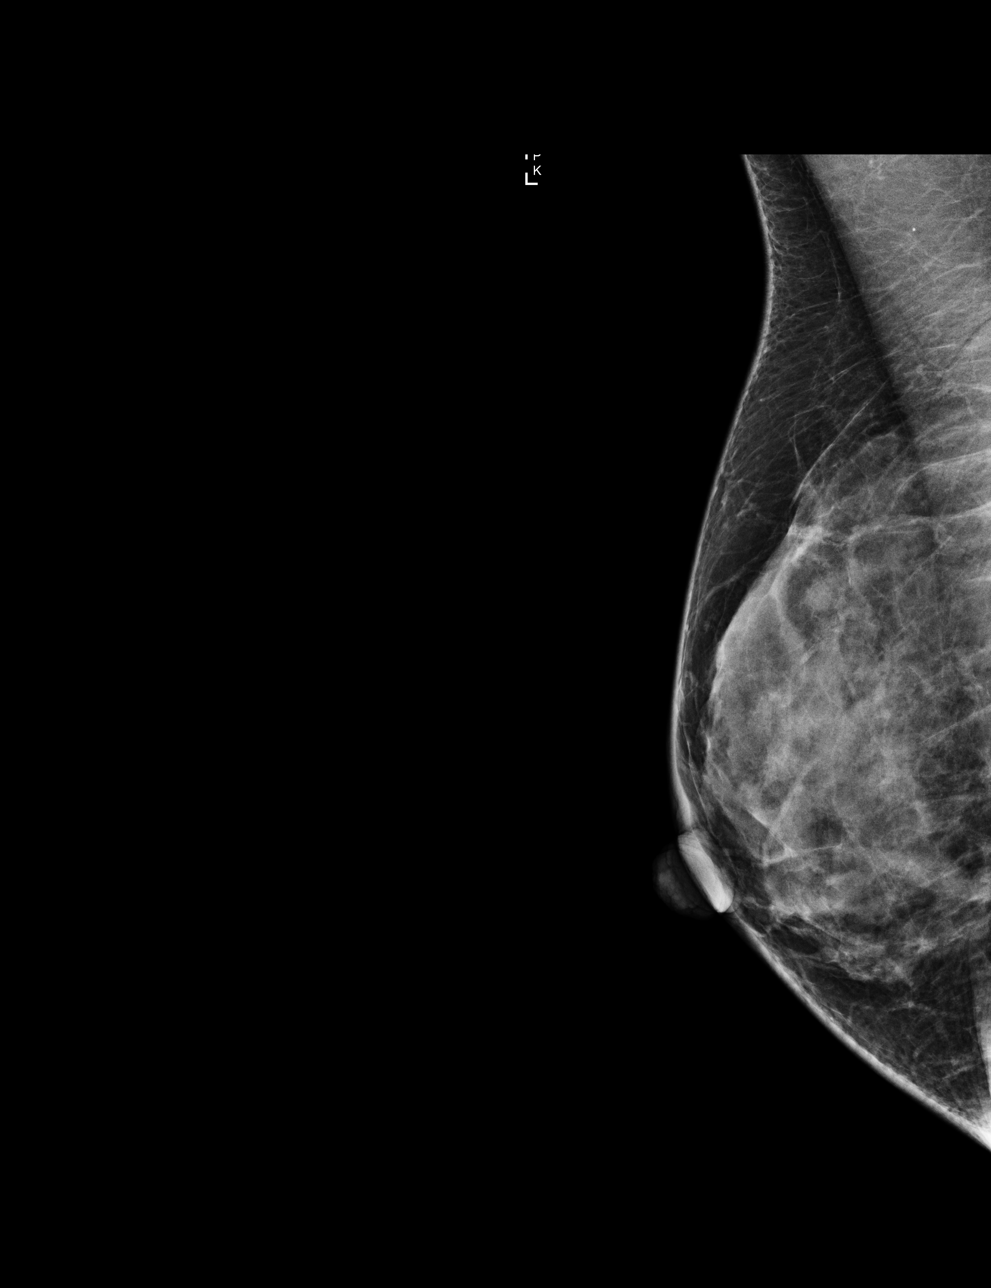

[4 of 4 positions shown; findings below may reference images not displayed]

ACR Breast Density Category c: The breast tissue is heterogeneously
dense, which may obscure small masses
FINDINGS: There are no findings suspicious for malignancy. Images were
processed with CAD.
IMPRESSION: No mammographic evidence of malignancy. A result letter of this
screening mammogram will be mailed directly to the patient.

RECOMMENDATION:
Screening mammogram at age 38. (Code:QD-5-EDU)

BI-RADS CATEGORY  1: Negative.

## 2018-12-07 ENCOUNTER — Ambulatory Visit: Payer: 59 | Admitting: Gastroenterology

## 2018-12-07 ENCOUNTER — Ambulatory Visit (INDEPENDENT_AMBULATORY_CARE_PROVIDER_SITE_OTHER)
Admission: RE | Admit: 2018-12-07 | Discharge: 2018-12-07 | Disposition: A | Discharge status: Home / Self Care | Payer: 59 | Source: Ambulatory Visit | Attending: Gastroenterology | Admitting: Gastroenterology

## 2018-12-07 ENCOUNTER — Other Ambulatory Visit (INDEPENDENT_AMBULATORY_CARE_PROVIDER_SITE_OTHER): Payer: 59

## 2018-12-07 ENCOUNTER — Encounter: Payer: Self-pay | Admitting: Gastroenterology

## 2018-12-07 ENCOUNTER — Other Ambulatory Visit: Payer: Self-pay

## 2018-12-07 VITALS — BP 110/70 | HR 83 | Temp 98.4°F | Ht 66.0 in | Wt 121.0 lb

## 2018-12-07 DIAGNOSIS — K582 Mixed irritable bowel syndrome: Secondary | ICD-10-CM

## 2018-12-07 DIAGNOSIS — R109 Unspecified abdominal pain: Secondary | ICD-10-CM

## 2018-12-07 LAB — IGA: IgA: 240 mg/dL (ref 68–378)

## 2018-12-07 NOTE — Progress Notes (Signed)
Gina Cantrell    [id number]    October 10, 1980  Primary Care Physician:Grewal, Marcelino Duster, MD  Referring Physician: No referring provider defined for this encounter.   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 Complains of abdominal pain, occasionally sharp, feels like someone stabbing, also has severe cramping.  She has constant abdominal pressure and she needs to lay down to relieve it.  Even during work hours she tries to recline whenever she can to relieve the pressure. Her diet is predominantly vegetarian.  She does not avoid gluten or lactose. She has irregular bowel habits with constipation alternating with diarrhea.  She may not have a bowel movement for 5 to 7 days followed by multiple bowel movements and severe abdominal cramping. She had uveitis few years ago.  Her identical twin sister had positive gene test for celiac disease.  She also has ankylosing spondylolysis.  Her mother has Sjogren's disease.    She feels she has been suffering with GI and urinary symptoms for a long period of time now  Denies any vomiting, recent weight change, melena or blood per rectum.  Abdominal ultrasound 11/01/2018:  IUD within endometrial cavity No free fluid Distended colon  No family history of GI malignancy or IBD  Outpatient Encounter Medications as of 12/07/2018  Medication Sig  . lisdexamfetamine (VYVANSE) 70 MG capsule Take 70 mg by mouth daily.  . [DISCONTINUED] lisdexamfetamine (VYVANSE) 50 MG capsule Take 50 mg by mouth daily.   No facility-administered encounter medications on file as of 12/07/2018.     Allergies as of 12/07/2018 - Review Complete 12/07/2018  Allergen Reaction Noted  . Tobrex [tobramycin sulfate]  10/08/2010    Past Medical History:  Diagnosis Date  . Anemia   . Anxiety   . Asthma   . GERD (gastroesophageal reflux disease)   .  Migraine   . Urinary tract infection    freq.    Past Surgical History:  Procedure Laterality Date  . CESAREAN SECTION    . CESAREAN SECTION  10/18/2010   Procedure: CESAREAN SECTION;  Surgeon: Jeani Hawking, MD;  Location: [facility];  Service: Gynecology;  Laterality: N/A;  Repeat (request case for Avra,Wanda, Colleen Dr. Rodman Pickle))  . INTRAUTERINE DEVICE INSERTION      Family History  Problem Relation Age of Onset  . Heart disease Father   . Ankylosing spondylitis Sister        this is patients twin  . Colon cancer Neg Hx     Social History   Socioeconomic History  . Marital status: Married    Spouse name: Not on file  . Number of children: Not on file  . Years of education: Not on file  . Highest education level: Not on file  Occupational History  . Not on file  Social Needs  . Financial resource strain: Not on file  . Food insecurity    Worry: Not on file    Inability: Not on file  . Transportation needs    Medical: Not on file    Non-medical: Not on file  Tobacco Use  . Smoking status: Former Smoker    Years: 7.00    Quit date: 08/31/2006    Years since quitting: 12.2  . Smokeless tobacco: Never Used  Substance and Sexual Activity  . Alcohol use: Yes    Comment: wine qod  . Drug  use: No  . Sexual activity: Yes    Partners: Male    Birth control/protection: I.U.D.  Lifestyle  . Physical activity    Days per week: Not on file    Minutes per session: Not on file  . Stress: Not on file  Relationships  . Social Herbalist on phone: Not on file    Gets together: Not on file    Attends religious service: Not on file    Active member of club or organization: Not on file    Attends meetings of clubs or organizations: Not on file    Relationship status: Not on file  . Intimate partner violence    Fear of current or ex partner: Not on file    Emotionally abused: Not on file    Physically abused: Not on file    Forced sexual activity: Not on file   Other Topics Concern  . Not on file  Social History Narrative  . Not on file      Review of systems: Review of Systems  Constitutional: Negative for fever and chills. Positive for fatigue HENT: Negative.   Eyes: Negative for blurred vision.  Respiratory: Negative for cough, shortness of breath and wheezing.   Cardiovascular: Negative for chest pain and palpitations.  Gastrointestinal: as per HPI Genitourinary: Negative for dysuria, urgency, frequency and hematuria.  Musculoskeletal: Negative for myalgias, back pain and joint pain.  Skin: Negative for itching and rash.  Neurological: Negative for dizziness, tremors, focal weakness, seizures and loss of consciousness. Positive for headaches Endo/Heme/Allergies: Negative.  Psychiatric/Behavioral: Negative for depression, suicidal ideas and hallucinations. Positive for anxiety All other systems reviewed and are negative.   Physical Exam: Vitals:   12/07/18 1439  BP: 110/70  Pulse: 83  Temp: 98.4 F (36.9 C)   Body mass index is 19.53 kg/m. Gen:      No acute distress HEENT:  EOMI, sclera anicteric Neck:     No masses; no thyromegaly Lungs:    Clear to auscultation bilaterally; normal respiratory effort CV:         Regular rate and rhythm; no murmurs Abd:      + bowel sounds; soft, non-tender; no palpable masses, no distension Ext:    No edema; adequate peripheral perfusion Skin:      Warm and dry; no rash Neuro: alert and oriented x 3 Psych: normal mood and affect  Data Reviewed:  Reviewed labs, radiology imaging, old records and pertinent past GI work up   Assessment and Plan/Recommendations:  38 year old female with complaints of generalized abdominal bloating, abdominal cramping with alternating constipation and diarrhea  Obtain abdominal x-ray to evaluate for stool burden, if has excessive stool burden will plan for bowel purge  Start Benefiber 1 tablespoon 3 times daily with meals Increase fluid intake to  60 ounces daily  If continues to have persistent constipation, will consider starting low-dose laxative  Check IgA level and celiac panel to exclude celiac disease  Abdominal cramping: Levsin sublingual twice daily as needed for severe spasms Trial of IBgard 1 capsule up to 3 times daily as needed  Return in 3 months or sooner if needed  Greater than 50% of the time used for counseling as well as treatment plan and follow-up. She had multiple questions which were answered to her satisfaction  K. Denzil Magnuson , MD    CC: No ref. provider found

## 2018-12-07 NOTE — Patient Instructions (Signed)
Your provider has requested that you have an abdominal x ray before leaving today. Please go to the basement floor to our Radiology department for the test.  Go to the basement for labs today   Take IB Gard 1 capsule three times a day as needed  Dr Silverio Decamp recommends that you complete a bowel purge (to clean out your bowels). Please do the following: Follow instructions on Plenvu box  You should expect results within 1 to 6 hours after completing the bowel purge.  You will only do the bowel purge if Xray shows stool burden  Take Benefiber 1 tablespoon three times a day with meals  We will send Levsin to your pharmacy  Follow up in 3 months  If you are age 40 or older, your body mass index should be between 23-30. Your Body mass index is 19.53 kg/m. If this is out of the aforementioned range listed, please consider follow up with your Primary Care Provider.  If you are age 38 or younger, your body mass index should be between 19-25. Your Body mass index is 19.53 kg/m. If this is out of the aformentioned range listed, please consider follow up with your Primary Care Provider.    I appreciate the  opportunity to care for you  Thank You   Harl Bowie , MD

## 2018-12-11 LAB — GLIADIN ANTIBODIES, SERUM
Gliadin IgA: 4 Units
Gliadin IgG: 2 Units

## 2018-12-11 LAB — TISSUE TRANSGLUTAMINASE, IGA: (tTG) Ab, IgA: 1 U/mL

## 2018-12-11 LAB — RETICULIN ANTIBODIES, IGA W TITER: Reticulin IGA Screen: NEGATIVE

## 2018-12-12 ENCOUNTER — Encounter: Payer: Self-pay | Admitting: Gastroenterology

## 2019-05-28 ENCOUNTER — Telehealth: Payer: Self-pay | Admitting: Neurology

## 2019-05-28 NOTE — Telephone Encounter (Signed)
Can you put her on my schedule for Thursday the 27th at 1130 please? Thanks!

## 2019-05-28 NOTE — Telephone Encounter (Signed)
Spoke with patient she is scheduled for the 27th of May at 11:30 Per Dr. Lucia Gaskins

## 2019-06-12 NOTE — Progress Notes (Signed)
error 

## 2019-06-13 ENCOUNTER — Telehealth: Payer: Self-pay | Admitting: *Deleted

## 2019-06-13 ENCOUNTER — Other Ambulatory Visit: Payer: Self-pay

## 2019-06-13 ENCOUNTER — Ambulatory Visit: Payer: 59 | Admitting: Neurology

## 2019-06-13 ENCOUNTER — Encounter: Payer: Self-pay | Admitting: Neurology

## 2019-06-13 VITALS — BP 129/87 | HR 90 | Ht 66.0 in | Wt 120.0 lb

## 2019-06-13 DIAGNOSIS — R51 Headache with orthostatic component, not elsewhere classified: Secondary | ICD-10-CM

## 2019-06-13 DIAGNOSIS — R519 Headache, unspecified: Secondary | ICD-10-CM

## 2019-06-13 DIAGNOSIS — G43709 Chronic migraine without aura, not intractable, without status migrainosus: Secondary | ICD-10-CM

## 2019-06-13 DIAGNOSIS — H538 Other visual disturbances: Secondary | ICD-10-CM

## 2019-06-13 DIAGNOSIS — G4484 Primary exertional headache: Secondary | ICD-10-CM

## 2019-06-13 DIAGNOSIS — G43009 Migraine without aura, not intractable, without status migrainosus: Secondary | ICD-10-CM | POA: Insufficient documentation

## 2019-06-13 MED ORDER — RIZATRIPTAN BENZOATE 10 MG PO TBDP: 10.0000 mg | ORAL_TABLET | ORAL | 11 refills | Status: DC | PRN

## 2019-06-13 MED ORDER — AJOVY 225 MG/1.5ML ~~LOC~~ SOAJ: 225.0000 mg | SUBCUTANEOUS | 4 refills | Status: DC

## 2019-06-13 NOTE — Progress Notes (Signed)
[facility]    Provider:  Dr Lucia Gaskins Requesting Provider: Self Referred Primary Care Provider:  Marcelle Overlie, MD  CC:  migraines  HPI:  Gina Cantrell is a 39 y.o. female here for migraines. PMHx migraine, gerd, asthma, anxiety, anemia, adhd. Migraines worsening. Smells really trigger migraines. She has had headaches started 4 years ago, no known family history of headaches. She has photophobia as well and light can trigger, nausea, lightheadedness, starts on the right side behind the eye, pulsating, pounding, throbbing, moderately severe to severe, its debilitating, she also has phonophobia as well but not as bad, going to a dark room and laying down helps, worsening especially the osmophobia. 15 headache days a month more so with heat or sun, 8 migraine days a month. The migraines last 4+ hours, better with excedrin but increasing use. She can wake up with the headaches and she is reporting vision changes/blurry vision with the migraines, they are worse positionally and she has to stay still, worsening in frequency and severity. She has never had brain imaging before. No other focal neurologic deficits, associated symptoms, inciting events or modifiable factors.  Review of Systems: Patient complains of symptoms per HPI as well as the following symptoms: adhd. Pertinent negatives and positives per HPI. All others negative.   Social History   Socioeconomic History  . Marital status: Married    Spouse name: Not on file  . Number of children: 2  . Years of education: Not on file  . Highest education level: Doctorate  Occupational History  . Occupation: Endodonist  Tobacco Use  . Smoking status: Former Smoker    Years: 7.00    Quit date: 08/31/2006    Years since quitting: 12.7  . Smokeless tobacco: Never Used  Substance and Sexual Activity  . Alcohol use: Yes    Alcohol/week: 2.0 standard drinks    Types: 2 Glasses of wine per week  . Drug use: No  .  Sexual activity: Yes    Partners: Male    Birth control/protection: I.U.D.  Other Topics Concern  . Not on file  Social History Narrative   Lives at home with husband and kids   Works at [employer]   Right handed   Caffeine: coffee, diet coke (1 per day while working)   International aid/development worker of Corporate investment banker Strain:   . Difficulty of Paying Living Expenses:   Food Insecurity:   . Worried About Programme researcher, broadcasting/film/video in the Last Year:   . Barista in the Last Year:   Transportation Needs:   . Freight forwarder (Medical):   Marland Kitchen Lack of Transportation (Non-Medical):   Physical Activity:   . Days of Exercise per Week:   . Minutes of Exercise per Session:   Stress:   . Feeling of Stress :   Social Connections:   . Frequency of Communication with Friends and Family:   . Frequency of Social Gatherings with Friends and Family:   . Attends Religious Services:   . Active Member of Clubs or Organizations:   . Attends Banker Meetings:   Marland Kitchen Marital Status:   Intimate Partner Violence:   . Fear of Current or Ex-Partner:   . Emotionally Abused:   Marland Kitchen Physically Abused:   . Sexually Abused:     Family History  Problem Relation Age of Onset  . Heart disease Father   . Depression Father   . Ankylosing spondylitis Sister  this is patients twin  . Colon cancer Neg Hx   . Migraines Neg Hx   . Headache Neg Hx     Past Medical History:  Diagnosis Date  . ADHD   . Anemia   . Anxiety   . Asthma   . GERD (gastroesophageal reflux disease)   . Migraine   . Urinary tract infection    freq.    Patient Active Problem List   Diagnosis Date Noted  . Screening for cardiovascular condition 08/31/2015  . S/P cesarean section 10/18/2010    Past Surgical History:  Procedure Laterality Date  . CESAREAN SECTION  2009  . CESAREAN SECTION  10/18/2010   Procedure: CESAREAN SECTION;  Surgeon: Jeani Hawking, MD;  Location: [facility];   Service: Gynecology;  Laterality: N/A;  Repeat (request case for Avra,Wanda, Colleen Dr. Rodman Pickle))  . INTRAUTERINE DEVICE INSERTION      Current Outpatient Medications  Medication Sig Dispense Refill  . Aspirin-Acetaminophen-Caffeine (EXCEDRIN MIGRAINE PO) Take by mouth as needed.    . Levocetirizine Dihydrochloride (XYZAL PO) Take by mouth.    . lisdexamfetamine (VYVANSE) 70 MG capsule Take 70 mg by mouth daily.    . Fremanezumab-vfrm (AJOVY) 225 MG/1.5ML SOAJ Inject 225 mg into the skin every 30 (thirty) days. 3 pen 4  . rizatriptan (MAXALT-MLT) 10 MG disintegrating tablet Take 1 tablet (10 mg total) by mouth as needed for migraine. May repeat in 2 hours if needed 9 tablet 11   No current facility-administered medications for this visit.    Allergies as of 06/13/2019 - Review Complete 06/13/2019  Allergen Reaction Noted  . Tobrex [tobramycin sulfate]  10/08/2010    Vitals: BP 129/87 (BP Location: Left Arm, Patient Position: Sitting)   Pulse 90   Ht 5\' 6"  (1.676 m)   Wt 120 lb (54.4 kg)   BMI 19.37 kg/m  Last Weight:  Wt Readings from Last 1 Encounters:  06/13/19 120 lb (54.4 kg)   Last Height:   Ht Readings from Last 1 Encounters:  06/13/19 5\' 6"  (1.676 m)     Physical exam: Exam: Gen: NAD, conversant, well nourised, well groomed                     CV: RRR, no MRG. No Carotid Bruits. No peripheral edema, warm, nontender Eyes: Conjunctivae clear without exudates or hemorrhage  Neuro: Detailed Neurologic Exam  Speech:    Speech is normal; fluent and spontaneous with normal comprehension.  Cognition:    The patient is oriented to person, place, and time;     recent and remote memory intact;     language fluent;     normal attention, concentration,     fund of knowledge Cranial Nerves:    The pupils are equal, round, and reactive to light. The fundi are flat. Visual fields are full to finger confrontation. Extraocular movements are intact. Trigeminal sensation  is intact and the muscles of mastication are normal. The face is symmetric. The palate elevates in the midline. Hearing intact. Voice is normal. Shoulder shrug is normal. The tongue has normal motion without fasciculations.   Coordination:    Normal finger to nose and heel to shin. Normal rapid alternating movements.   Gait:    Heel-toe and tandem gait are normal.   Motor Observation:    No asymmetry, no atrophy, and no involuntary movements noted. Tone:    Normal muscle tone.    Posture:    Posture is normal. normal  erect    Strength:    Strength is V/V in the upper and lower limbs.      Sensation: intact to LT     Reflex Exam:  DTR's:    Deep tendon reflexes in the upper and lower extremities are normal bilaterally.   Toes:    The toes are downgoing bilaterally.   Clonus:    Clonus is absent.    Assessment/Plan:  Patient with chronic migraines. We had a long discussion about migraine, pathophysiology, natural progression, lifestyle factors and triggers. We thoroughly reviewed options for prevention and acute management. However given some concerning symptoms she should also have MRI brain.  MRI brain due to concerning symptoms of morning headaches, positional headaches,vision changes, worsening headaches  to look for space occupying mass, chiari or intracranial hypertension (pseudotumor).   Acutely Rizatriptan: Please take one tablet at the onset of your headache. If it does not improve the symptoms please take one additional tablet in 2 hours. Do not take more then 2 tablets in 24hrs.   Preventative: Ajovy  Discussed: To prevent or relieve headaches, try the following: Cool Compress. Lie down and place a cool compress on your head.  Avoid headache triggers. If certain foods or odors seem to have triggered your migraines in the past, avoid them. A headache diary might help you identify triggers.  Include physical activity in your daily routine. Try a daily walk or other  moderate aerobic exercise.  Manage stress. Find healthy ways to cope with the stressors, such as delegating tasks on your to-do list.  Practice relaxation techniques. Try deep breathing, yoga, massage and visualization.  Eat regularly. Eating regularly scheduled meals and maintaining a healthy diet might help prevent headaches. Also, drink plenty of fluids.  Follow a regular sleep schedule. Sleep deprivation might contribute to headaches Consider biofeedback. With this mind-body technique, you learn to control certain bodily functions -- such as muscle tension, heart rate and blood pressure -- to prevent headaches or reduce headache pain.    Proceed to emergency room if you experience new or worsening symptoms or symptoms do not resolve, if you have new neurologic symptoms or if headache is severe, or for any concerning symptom.   Provided education and documentation from American headache Society toolbox including articles on: chronic migraine medication overuse headache, chronic migraines, prevention of migraines, behavioral and other nonpharmacologic treatments for headache.    Orders Placed This Encounter  Procedures  . MR BRAIN W WO CONTRAST   Meds ordered this encounter  Medications  . rizatriptan (MAXALT-MLT) 10 MG disintegrating tablet    Sig: Take 1 tablet (10 mg total) by mouth as needed for migraine. May repeat in 2 hours if needed    Dispense:  9 tablet    Refill:  11  . Fremanezumab-vfrm (AJOVY) 225 MG/1.5ML SOAJ    Sig: Inject 225 mg into the skin every 30 (thirty) days.    Dispense:  3 pen    Refill:  4    Patient will be using the copay card which allows for 3 pens to be dispensed at once.    Cc: Dian Queen, MD,    Sarina Ill, MD  [facility] [address] [place], [place] [address]  Phone [phone] Fax [phone]  I spent 60 minutes of face-to-face and non-face-to-face time with patient on the  1. Chronic  migraine without aura without status migrainosus, not intractable   2. Blurry vision   3. Morning headache   4.  Positional headache   5. Exertional headache   6. Worsening headaches    diagnosis.  This included previsit chart review, lab review, study review, order entry, electronic health record documentation, patient education on the different diagnostic and therapeutic options, counseling and coordination of care, risks and benefits of management, compliance, or risk factor reduction

## 2019-06-13 NOTE — Telephone Encounter (Signed)
Please call pt & schedule 4 month follow up with Dr. Lucia Gaskins.

## 2019-06-13 NOTE — Patient Instructions (Signed)
[place] Rizatriptan: Please take one tablet at the onset of your headache. If it does not improve the symptoms please take one additional tablet in 2 hours. Do not take more then 2 tablets in 24hrs.   Preventative: [place] injection What is this medicine? FREMANEZUMAB (fre ma NEZ ue mab) is used to prevent migraine headaches. This medicine may be used for other purposes; ask your health care provider or pharmacist if you have questions. COMMON BRAND NAME(S): AJOVY What should I tell my health care provider before I take this medicine? They need to know if you have any of these conditions:  an unusual or allergic reaction to fremanezumab, other medicines, foods, dyes, or preservatives  pregnant or trying to get pregnant  breast-feeding How should I use this medicine? This medicine is for injection under the skin. You will be taught how to prepare and give this medicine. Use exactly as directed. Take your medicine at regular intervals. Do not take your medicine more often than directed. It is important that you put your used needles and syringes in a special sharps container. Do not put them in a trash can. If you do not have a sharps container, call your pharmacist or healthcare provider to get one. Talk to your pediatrician regarding the use of this medicine in children. Special care may be needed. Overdosage: If you think you have taken too much of this medicine contact a poison control center or emergency room at once. NOTE: This medicine is only for you. Do not share this medicine with others. What if I miss a dose? If you miss a dose, take it as soon as you can. If it is almost time for your next dose, take only that dose. Do not take double or extra doses. What may interact with this medicine? Interactions are not expected. This list may not describe all possible interactions. Give your health care provider a list of all the medicines, herbs, non-prescription drugs, or  dietary supplements you use. Also tell them if you smoke, drink alcohol, or use illegal drugs. Some items may interact with your medicine. What should I watch for while using this medicine? Tell your doctor or healthcare professional if your symptoms do not start to get better or if they get worse. What side effects may I notice from receiving this medicine? Side effects that you should report to your doctor or health care professional as soon as possible:  allergic reactions like skin rash, itching or hives, swelling of the face, lips, or tongue Side effects that usually do not require medical attention (report these to your doctor or health care professional if they continue or are bothersome):  pain, redness, or irritation at site where injected This list may not describe all possible side effects. Call your doctor for medical advice about side effects. You may report side effects to FDA at 1-800-FDA-1088. Where should I keep my medicine? Keep out of the reach of children. You will be instructed on how to store this medicine. Throw away any unused medicine after the expiration date on the label. NOTE: This sheet is a summary. It may not cover all possible information. If you have questions about this medicine, talk to your doctor, pharmacist, or health care provider.  2020 Elsevier/Gold Standard (2016-10-03 17:22:56) Rizatriptan tablets What is this medicine? RIZATRIPTAN (rye za TRIP tan) is used to treat migraines with or without aura. An aura is a strange feeling or visual disturbance that warns you of an attack. It  is not used to prevent migraines. This medicine may be used for other purposes; ask your health care provider or pharmacist if you have questions. COMMON BRAND NAME(S): Maxalt What should I tell my health care provider before I take this medicine? They need to know if you have any of these conditions:  cigarette smoker  circulation problems in fingers and  toes  diabetes  heart disease  high blood pressure  high cholesterol  history of irregular heartbeat  history of stroke  kidney disease  liver disease  stomach or intestine problems  an unusual or allergic reaction to rizatriptan, other medicines, foods, dyes, or preservatives  pregnant or trying to get pregnant  breast-feeding How should I use this medicine? Take this medicine by mouth with a glass of water. Follow the directions on the prescription label. Do not take it more often than directed. Talk to your pediatrician regarding the use of this medicine in children. While this drug may be prescribed for children as young as 6 years for selected conditions, precautions do apply. Overdosage: If you think you have taken too much of this medicine contact a poison control center or emergency room at once. NOTE: This medicine is only for you. Do not share this medicine with others. What if I miss a dose? This does not apply. This medicine is not for regular use. What may interact with this medicine? Do not take this medicine with any of the following medicines:  certain medicines for migraine headache like almotriptan, eletriptan, frovatriptan, naratriptan, rizatriptan, sumatriptan, zolmitriptan  ergot alkaloids like dihydroergotamine, ergonovine, ergotamine, methylergonovine  MAOIs like Carbex, Eldepryl, Marplan, Nardil, and Parnate This medicine may also interact with the following medications:  certain medicines for depression, anxiety, or psychotic disorders  propranolol This list may not describe all possible interactions. Give your health care provider a list of all the medicines, herbs, non-prescription drugs, or dietary supplements you use. Also tell them if you smoke, drink alcohol, or use illegal drugs. Some items may interact with your medicine. What should I watch for while using this medicine? Visit your healthcare professional for regular checks on your  progress. Tell your healthcare professional if your symptoms do not start to get better or if they get worse. You may get drowsy or dizzy. Do not drive, use machinery, or do anything that needs mental alertness until you know how this medicine affects you. Do not stand up or sit up quickly, especially if you are an older patient. This reduces the risk of dizzy or fainting spells. Alcohol may interfere with the effect of this medicine. Your mouth may get dry. Chewing sugarless gum or sucking hard candy and drinking plenty of water may help. Contact your healthcare professional if the problem does not go away or is severe. If you take migraine medicines for 10 or more days a month, your migraines may get worse. Keep a diary of headache days and medicine use. Contact your healthcare professional if your migraine attacks occur more frequently. What side effects may I notice from receiving this medicine? Side effects that you should report to your doctor or health care professional as soon as possible:  allergic reactions like skin rash, itching or hives, swelling of the face, lips, or tongue  chest pain or chest tightness  signs and symptoms of a dangerous change in heartbeat or heart rhythm like chest pain; dizziness; fast, irregular heartbeat; palpitations; feeling faint or lightheaded; falls; breathing problems  signs and symptoms of a stroke like  changes in vision; confusion; trouble speaking or understanding; severe headaches; sudden numbness or weakness of the face, arm or leg; trouble walking; dizziness; loss of balance or coordination  signs and symptoms of serotonin syndrome like irritable; confusion; diarrhea; fast or irregular heartbeat; muscle twitching; stiff muscles; trouble walking; sweating; high fever; seizures; chills; vomiting Side effects that usually do not require medical attention (report to your doctor or health care professional if they continue or are  bothersome):  diarrhea  dizziness  drowsiness  dry mouth  headache  nausea, vomiting  pain, tingling, numbness in the hands or feet  stomach pain This list may not describe all possible side effects. Call your doctor for medical advice about side effects. You may report side effects to FDA at 1-800-FDA-1088. Where should I keep my medicine? Keep out of the reach of children. Store at room temperature between 15 and 30 degrees C (59 and 86 degrees F). Keep container tightly closed. Throw away any unused medicine after the expiration date. NOTE: This sheet is a summary. It may not cover all possible information. If you have questions about this medicine, talk to your doctor, pharmacist, or health care provider.  2020 Elsevier/Gold Standard (2017-07-18 14:59:59)

## 2019-06-19 ENCOUNTER — Telehealth: Payer: Self-pay | Admitting: Neurology

## 2019-06-19 ENCOUNTER — Telehealth: Payer: Self-pay | Admitting: *Deleted

## 2019-06-19 NOTE — Telephone Encounter (Signed)
Ajovy PA completed on Cover My Meds. Key: [id number]. Awaiting determination from [facility]. If insurance denies, pt can still use savings card for $5 copay.

## 2019-06-19 NOTE — Telephone Encounter (Signed)
[employer] Auth: [id number] (exp. 06/19/19 to 08/03/19) order sent to [facility]. They will reach out to the patient to schedule.

## 2019-06-20 NOTE — Telephone Encounter (Signed)
Received denial of Ajovy from [employer]. Pt did not meet requirements of tried and failed medications. Patient should be able to use the savings card.   From denial letter:   The request for coverage for Ajovy 225mg /1.60ml, use as directed (1 per month), is denied. This decision is based on health plan criteria for Ajovy. This medicine is covered only if: All of the following: (a) You have a trial and failure of at least two months, contraindication, or intolerance to two of the following prophylactic therapies (document name and date tried): (i) Amitriptyline (Elavil). (ii) One of the following beta-blockers: Atenolol, metoprolol, nadolol, propranolol, or timolol. (iii) Divalproex sodium (Depakote/Depakote ER). (iv) Topiramate (Topamax). (v) Venlafaxine (Effexor/Effexor XR). (vi) OnabotulinumtoxinA (Botox). (b) You have a trial and failure of at least three months, contraindication, or intolerance to both of the following (document date tried): (i) Aimovig*. (ii) Emgality* 120mg . The information provided does not show that you meet the criteria listed above.  You have the right to appeal this medication coverage decision within 180 calendar days from the date of this denial notification. If your prescriber submits the appeal on your behalf, be sure to have him or her include your signed authorization, allowing him or her to do so, along with a clear indication that the appeal is being requested on your behalf.  [employer] [address] [place], [address] [place] Phone: Please call the toll-free member number listed on your health plan ID card. Fax: [phone] Expedited/Urgent Fax: [phone]

## 2019-06-24 NOTE — Telephone Encounter (Signed)
Scheduled at GI for 06/27/19.

## 2019-06-27 ENCOUNTER — Ambulatory Visit
Admission: RE | Admit: 2019-06-27 | Discharge: 2019-06-27 | Disposition: A | Discharge status: Home / Self Care | Payer: 59 | Source: Ambulatory Visit | Attending: Neurology | Admitting: Neurology

## 2019-06-27 ENCOUNTER — Other Ambulatory Visit: Payer: Self-pay

## 2019-06-27 DIAGNOSIS — H538 Other visual disturbances: Secondary | ICD-10-CM | POA: Diagnosis not present

## 2019-06-27 DIAGNOSIS — G4484 Primary exertional headache: Secondary | ICD-10-CM

## 2019-06-27 DIAGNOSIS — R51 Headache with orthostatic component, not elsewhere classified: Secondary | ICD-10-CM

## 2019-06-27 DIAGNOSIS — R519 Headache, unspecified: Secondary | ICD-10-CM

## 2019-06-27 MED ORDER — GADOBENATE DIMEGLUMINE 529 MG/ML IV SOLN
11.0000 mL | Freq: Once | INTRAVENOUS | Status: AC | PRN
  Administered 2019-06-27: 11 mL via INTRAVENOUS

## 2019-11-27 ENCOUNTER — Other Ambulatory Visit: Payer: Self-pay | Admitting: Neurology

## 2019-11-27 MED ORDER — METHYLPREDNISOLONE 4 MG PO TBPK: ORAL_TABLET | ORAL | 1 refills | Status: DC

## 2019-12-17 ENCOUNTER — Other Ambulatory Visit: Payer: Self-pay | Admitting: Family Medicine

## 2019-12-17 DIAGNOSIS — E079 Disorder of thyroid, unspecified: Secondary | ICD-10-CM

## 2020-01-03 ENCOUNTER — Other Ambulatory Visit: Payer: 59

## 2020-01-30 ENCOUNTER — Other Ambulatory Visit: Payer: 59

## 2020-10-29 IMAGING — DX DG ABDOMEN 2V
2 series · 2 of 2 positions shown · non-contrast
Comparison: None.

CLINICAL DATA: Chronic abdominal cramping, diarrhea, and
constipation. IBS.

EXAM:
ABDOMEN - 2 VIEW

[abdomen erect]
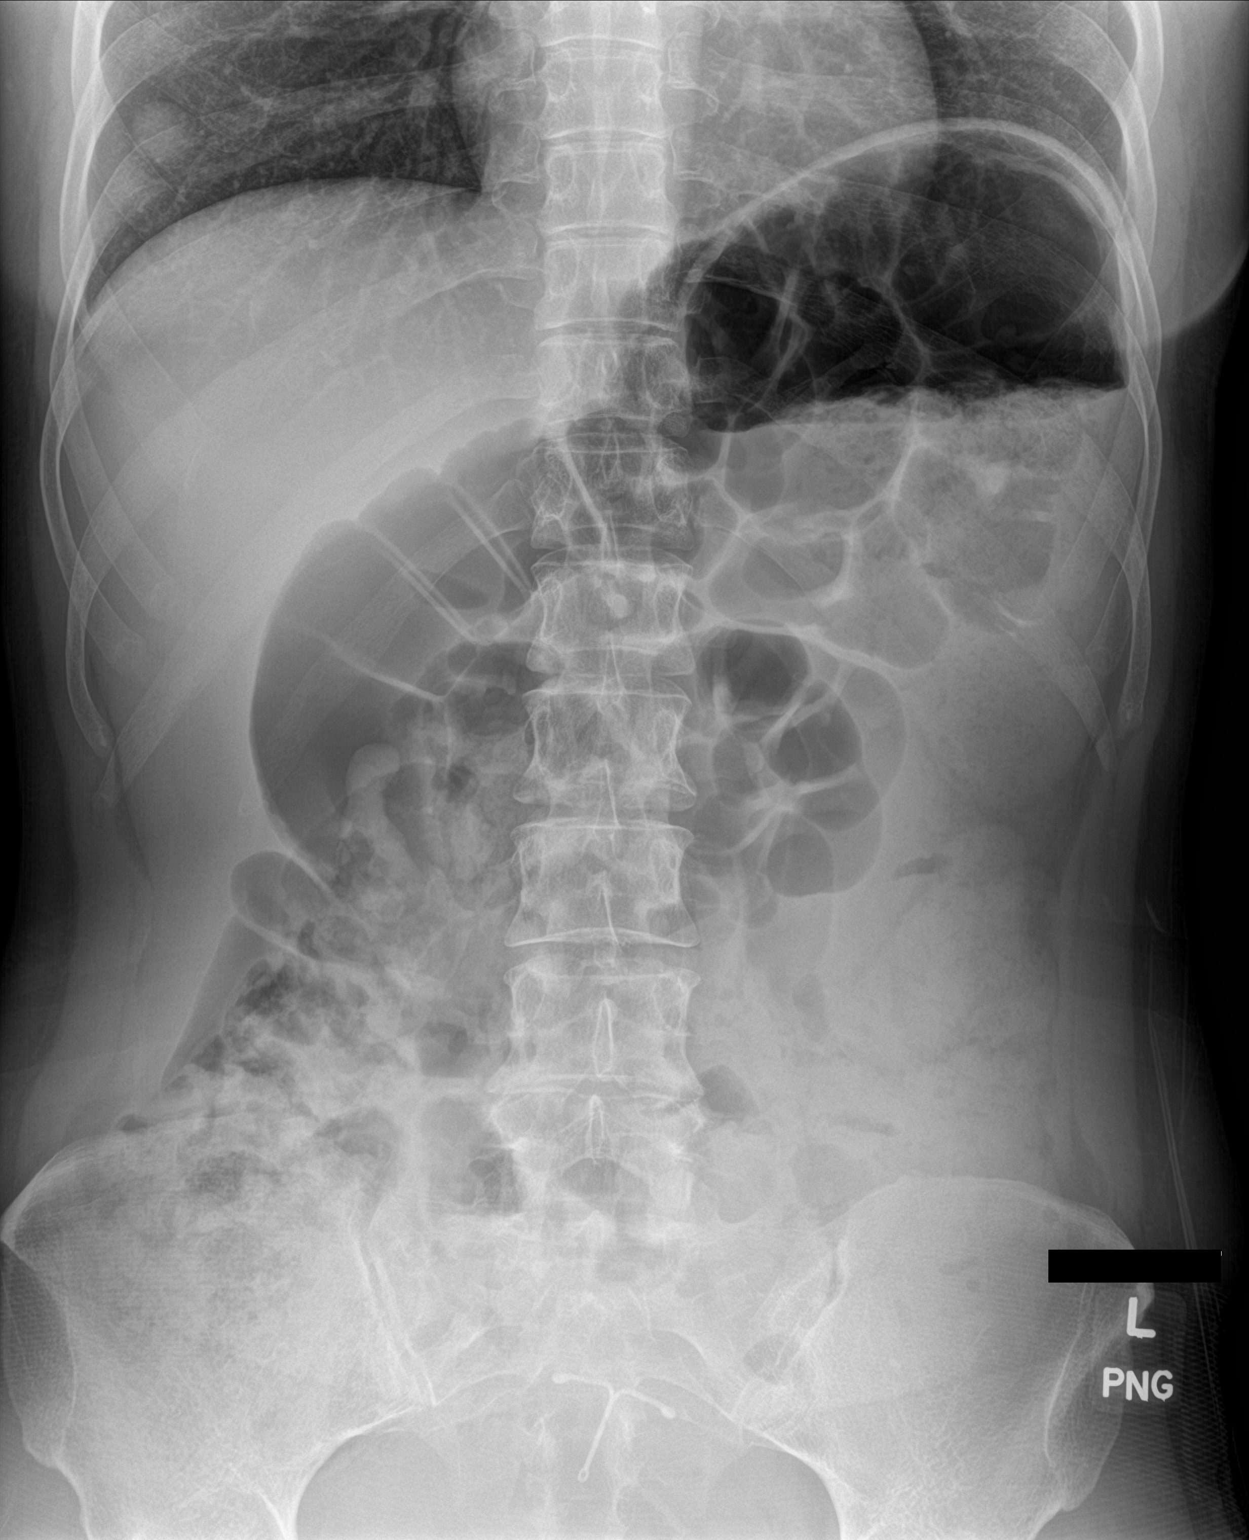

[abdomen supine]
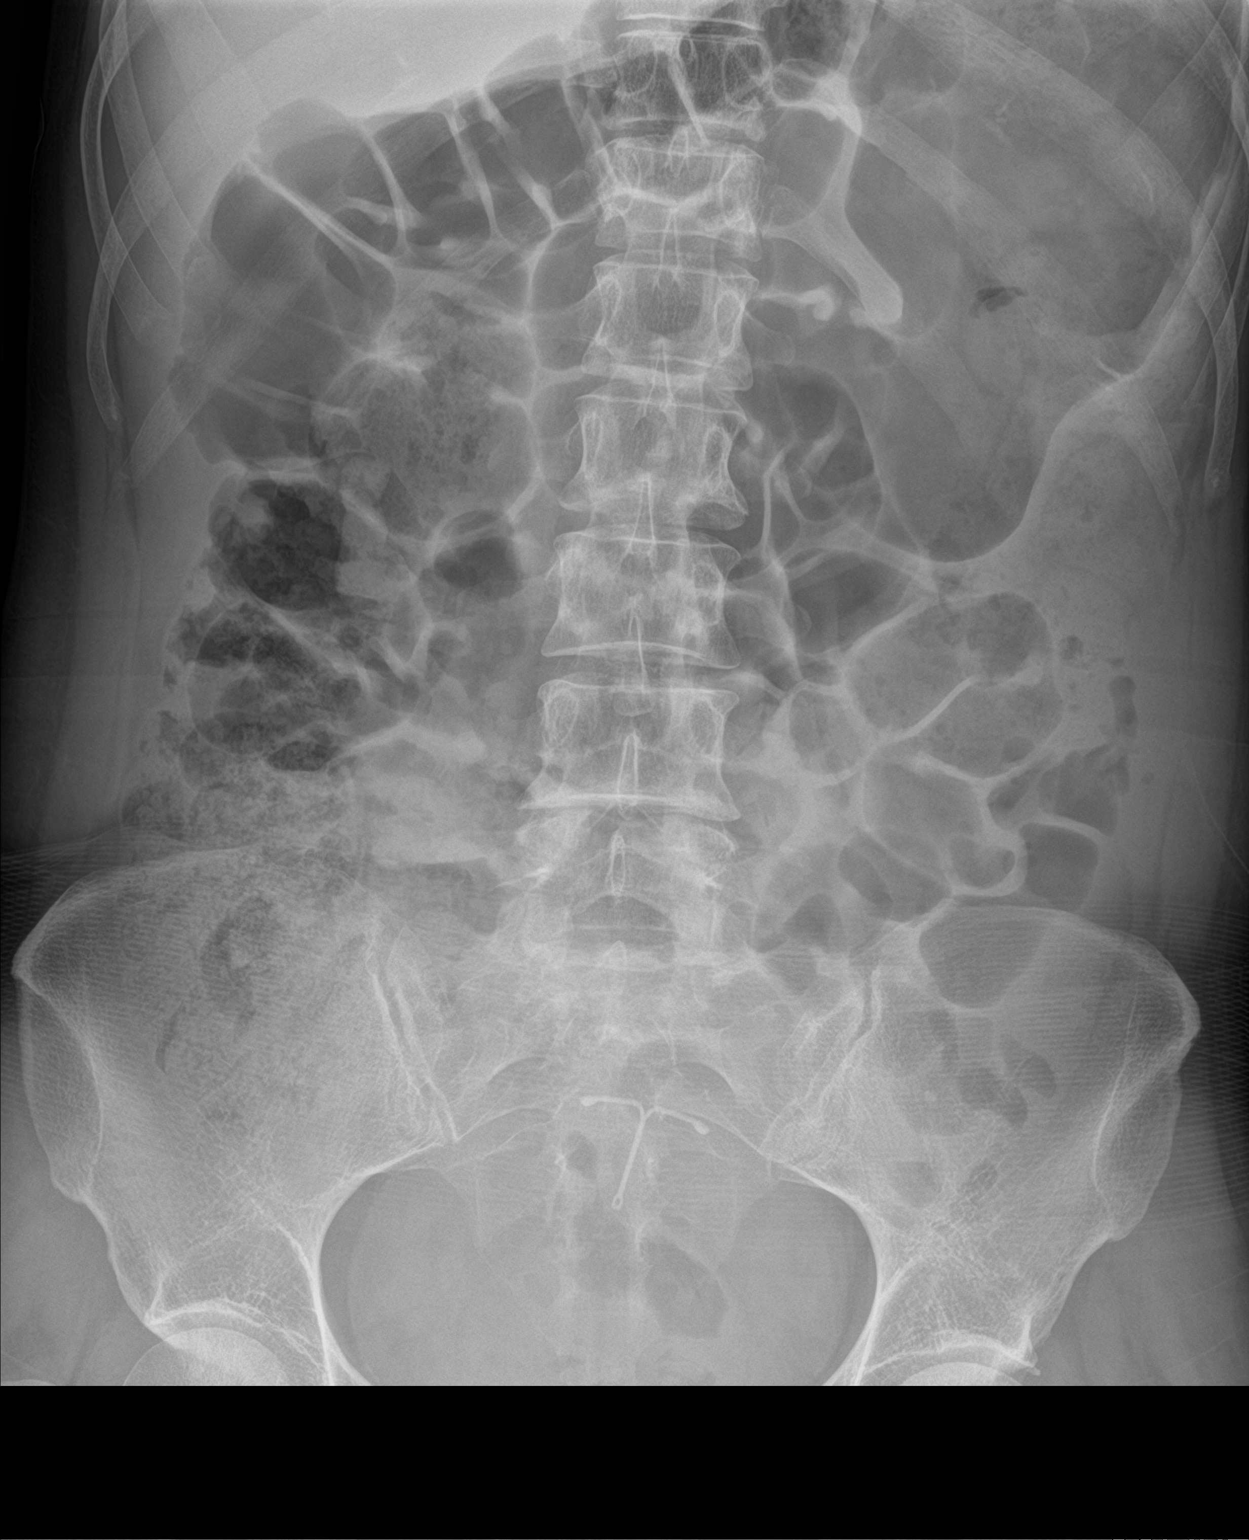

[2 of 2 positions shown; findings below may reference images not displayed]

FINDINGS: No bowel dilatation to suggest obstruction. Gaseous gastric
distension with air-fluid level. Moderate stool in the right and
descending colon. Mild gaseous distention of transverse colon in the
upper abdomen. IUD in the pelvis. No radiopaque calculi or abnormal
soft tissue calcifications. Lung bases are clear. No osseous
abnormalities are seen.
IMPRESSION: Nonspecific bowel gas pattern. Mild gaseous gastric distension with
air-fluid level. Moderate stool in the right and descending colon.
No evidence of bowel obstruction.

## 2022-05-25 ENCOUNTER — Ambulatory Visit: Payer: 59 | Admitting: Neurology

## 2022-06-07 ENCOUNTER — Ambulatory Visit: Payer: BC Managed Care – PPO | Admitting: Neurology

## 2022-06-07 ENCOUNTER — Encounter: Payer: Self-pay | Admitting: Neurology

## 2022-06-07 VITALS — BP 127/81 | HR 86 | Ht 66.0 in | Wt 125.4 lb

## 2022-06-07 DIAGNOSIS — G43009 Migraine without aura, not intractable, without status migrainosus: Secondary | ICD-10-CM

## 2022-06-07 MED ORDER — QULIPTA 60 MG PO TABS: 60.0000 mg | ORAL_TABLET | Freq: Every day | ORAL | 0 refills | Status: DC

## 2022-06-07 MED ORDER — NURTEC 75 MG PO TBDP: 75.0000 mg | ORAL_TABLET | Freq: Every day | ORAL | 6 refills | Status: DC | PRN

## 2022-06-07 MED ORDER — ONDANSETRON 4 MG PO TBDP: 4.0000 mg | ORAL_TABLET | Freq: Three times a day (TID) | ORAL | 3 refills | Status: DC | PRN

## 2022-06-07 MED ORDER — UBRELVY 100 MG PO TABS: 100.0000 mg | ORAL_TABLET | ORAL | 0 refills | Status: DC | PRN

## 2022-06-07 NOTE — Progress Notes (Addendum)
[facility]    Provider:  Dr Lucia Gaskins Requesting Provider: Self Referred Primary Care Provider:  Melida Quitter, MD  CC:  migraines  11/22/2022: Was doing exceptionally well on Qulipta > 50% iumprovement in migraine freq and severity. Tryoing to get it approved again. PLEASE USE COPAY CARD: BIN [id number] PCN [facility] GRP [id number] ID [id number]. 5-10 mod to severe migraine days a month WAS down to 4 migraine days a month on qulipta exceptional improvement NEEDS APPROVAL  Medications Tried > 3 months: tried Ajovy, aimovig is contraindicated due to constipation, Emgality, BP meds contraindicated due to hypotension,tried  topiramate, tried nortriptyline(side effects sedation), tried imitrex, tried rizatriptan, tried gabapentin, tried propranolol with hjypotention  06/07/2022; She has triggers. Perfume and odors. She feels it is related to peri-menopause. Motion sickness when not driving. Heat are triggers. 5 migraine days a month, < 10 total headache days a month. She has photophobia as well and light can trigger, nausea, lightheadedness, starts on the right side behind the eye, pulsating, pounding, throbbing, moderately severe to severe, its debilitating, she also has osmophobia as well but not as bad, going to a dark room and laying down helps, worsening especially the osmophobia  Patient complains of symptoms per HPI as well as the following symptoms: none . Pertinent negatives and positives per HPI. All others negative  HPI 06/13/2019:  Gina Cantrell is a 42 y.o. female here for migraines. PMHx migraine, gerd, asthma, anxiety, anemia, adhd. Migraines worsening. Smells really trigger migraines. She has had headaches started 4 years ago, no known family history of headaches. She has photophobia as well and light can trigger, nausea, lightheadedness, starts on the right side behind the eye, pulsating, pounding, throbbing, moderately severe to severe, its debilitating, she also has  phonophobia as well but not as bad, going to a dark room and laying down helps, worsening especially the osmophobia. 15 headache days a month more so with heat or sun, 8 migraine days a month. The migraines last 4+ hours, better with excedrin but increasing use. She can wake up with the headaches and she is reporting vision changes/blurry vision with the migraines, they are worse positionally and she has to stay still, worsening in frequency and severity. She has never had brain imaging before. No other focal neurologic deficits, associated symptoms, inciting events or modifiable factors.  06/27/2019:  IMPRESSION: This MRI of the brain without contrast shows the following: 1.   The pituitary gland is thinned within a mildly enlarged sella turcica.  This is usually an incidental finding, especially since optic nerve sheaths are normal in diameter, but could also be seen with elevated intracranial pressure.   2.   The brain is otherwise normal. 3.   Normal enhancement pattern and no acute findings.  Review of Systems: Patient complains of symptoms per HPI as well as the following symptoms: adhd. Pertinent negatives and positives per HPI. All others negative.  Medications Tried > 3 months: tried Ajovy, aimovig is contraindicated due to constipation, Emgality, BP meds contraindicated due to hypotension,tried  topiramate, tried nortriptyline(side effects sedation), tried imitrex, tried rizatriptan, tried gabapentin, tried propranolol with hjypotention   Social History   Socioeconomic History   Marital status: Married    Spouse name: Not on file   Number of children: 2   Years of education: Not on file   Highest education level: Doctorate  Occupational History   Occupation: Endodonist  Tobacco Use   Smoking status: Former    Years: 7  Types: Cigarettes    Quit date: 08/31/2006    Years since quitting: 15.7   Smokeless tobacco: Never  Vaping Use   Vaping Use: Never used  Substance and  Sexual Activity   Alcohol use: Yes    Alcohol/week: 2.0 standard drinks of alcohol    Types: 2 Glasses of wine per week   Drug use: No   Sexual activity: Yes    Partners: Male    Birth control/protection: I.U.D.  Other Topics Concern   Not on file  Social History Narrative   Lives at home with husband and kids   Works at [employer]   Right handed   Caffeine: coffee, diet coke (1 per day while working)   International aid/development worker of [employer] Strain: Not on [employer] Insecurity: Not on file  Transportation Needs: Not on file  Physical Activity: Not on file  Stress: Not on file  Social Connections: Not on file  Intimate Partner Violence: Not on file    Family History  Problem Relation Age of Onset   Heart disease Father    Depression Father    Ankylosing spondylitis Sister        this is patients twin   Colon cancer Neg Hx    Migraines Neg Hx    Headache Neg Hx     Past Medical History:  Diagnosis Date   ADHD    Anemia    Anxiety    Asthma    GERD (gastroesophageal reflux disease)    Migraine    Urinary tract infection    freq.    Patient Active Problem List   Diagnosis Date Noted   Migraine without aura and without status migrainosus, not intractable 06/13/2019   Screening for cardiovascular condition 08/31/2015   S/P cesarean section 10/18/2010    Past Surgical History:  Procedure Laterality Date   CESAREAN SECTION  2009   CESAREAN SECTION  10/18/2010   Procedure: CESAREAN SECTION;  Surgeon: Jeani Hawking, MD;  Location: [facility];  Service: Gynecology;  Laterality: N/A;  Repeat (request case for Avra,Wanda, Colleen Dr. Rodman Pickle))   INTRAUTERINE DEVICE INSERTION      Current Outpatient Medications  Medication Sig Dispense Refill   Aspirin-Acetaminophen-Caffeine (EXCEDRIN MIGRAINE PO) Take by mouth as needed.     Atogepant (QULIPTA) 60 MG TABS Take 1 tablet (60 mg total) by mouth daily. 30 tablet 0   lisdexamfetamine  (VYVANSE) 70 MG capsule Take 70 mg by mouth daily.     ondansetron (ZOFRAN-ODT) 4 MG disintegrating tablet Take 1-2 tablets (4-8 mg total) by mouth every 8 (eight) hours as needed. 30 tablet 3   Rimegepant Sulfate (NURTEC) 75 MG TBDP Take 1 tablet (75 mg total) by mouth daily as needed. For migraines. Take as close to onset of migraine as possible. One daily maximum. 10 tablet 6   Ubrogepant (UBRELVY) 100 MG TABS Take 1 tablet (100 mg total) by mouth every 2 (two) hours as needed. Maximum 200mg  a day. 10 tablet 0   Fremanezumab-vfrm (AJOVY) 225 MG/1.5ML SOAJ Inject 225 mg into the skin every 30 (thirty) days. (Patient not taking: Reported on 06/07/2022) 3 pen 4   rizatriptan (MAXALT-MLT) 10 MG disintegrating tablet Take 1 tablet (10 mg total) by mouth as needed for migraine. May repeat in 2 hours if needed (Patient not taking: Reported on 06/07/2022) 9 tablet 11   No current facility-administered medications for this visit.    Allergies as of 06/07/2022 - Review Complete  06/07/2022  Allergen Reaction Noted   Sulfamethoxazole-trimethoprim Nausea Only 07/22/2019   Tobrex [tobramycin sulfate]  10/08/2010    Vitals: BP 127/81   Pulse 86   Ht 5\' 6"  (1.676 m)   Wt 125 lb 6.4 oz (56.9 kg)   BMI 20.24 kg/m  Last Weight:  Wt Readings from Last 1 Encounters:  06/07/22 125 lb 6.4 oz (56.9 kg)   Last Height:   Ht Readings from Last 1 Encounters:  06/07/22 5\' 6"  (1.676 m)     Exam: NAD, pleasant                  Speech:    Speech is normal; fluent and spontaneous with normal comprehension.  Cognition:    The patient is oriented to person, place, and time;     recent and remote memory intact;     language fluent;    Cranial Nerves:    The pupils are equal, round, and reactive to light.Trigeminal sensation is intact and the muscles of mastication are normal. The face is symmetric. The palate elevates in the midline. Hearing intact. Voice is normal. Shoulder shrug is normal. The tongue has  normal motion without fasciculations.   Coordination:  No dysmetria  Motor Observation:    No asymmetry, no atrophy, and no involuntary movements noted. Tone:    Normal muscle tone.     Strength:    Strength is V/V in the upper and lower limbs.      Sensation: intact to LT    Assessment/Plan:  Patient with chronic migraines. We had a long discussion about migraine, pathophysiology, natural progression, lifestyle factors and triggers. We thoroughly reviewed options for prevention and acute management again. For at least 6 months she has had 5-10 moderate to severe migraine days a month and < 10 total headache days a month.  11/22/2022: Was doing exceptionally well on Qulipta > 50% iumprovement in migraine freq and severity. Tryoing to get it approved again. PLEASE USE COPAY CARD: BIN [id number] PCN [facility] GRP [id number] ID [id number]  Medications Tried > 3 months: tried Ajovy, aimovig is contraindicated due to constipation, Emgality, BP meds contraindicated due to hypotension,tried  topiramate, tried nortriptyline(side effects sedation), tried imitrex, tried rizatriptan, tried gabapentin, tried propranolol with hjypotention  She will call me/contact me in 2 weeks to give feedback and see if she wants a prescription:  Prevention: Start qulipta daily preventative 60mg  pill can cut in half Acutely: Nurtec or Ubrelvy right as soon as needed. Nurtec daily. Bernita Raisin can repeat in 2 hours.  Nurtec: super long half life, you can take IF you even think you are going to have a migraine  No more triptans, chest pain, contraindicated  No orders of the defined types were placed in this encounter.  Meds ordered this encounter  Medications   DISCONTD: ondansetron (ZOFRAN-ODT) 4 MG disintegrating tablet    Sig: Take 1-2 tablets (4-8 mg total) by mouth every 8 (eight) hours as needed.    Dispense:  30 tablet    Refill:  3   Rimegepant Sulfate (NURTEC) 75 MG TBDP    Sig: Take 1 tablet (75 mg total) by  mouth daily as needed. For migraines. Take as close to onset of migraine as possible. One daily maximum.    Dispense:  10 tablet    Refill:  6   Ubrogepant (UBRELVY) 100 MG TABS    Sig: Take 1 tablet (100 mg total) by mouth every 2 (two) hours as needed. Maximum 200mg  a  day.    Dispense:  10 tablet    Refill:  0   Atogepant (QULIPTA) 60 MG TABS    Sig: Take 1 tablet (60 mg total) by mouth daily.    Dispense:  30 tablet    Refill:  0   ondansetron (ZOFRAN-ODT) 4 MG disintegrating tablet    Sig: Take 1-2 tablets (4-8 mg total) by mouth every 8 (eight) hours as needed.    Dispense:  30 tablet    Refill:  3       Discussed: To prevent or relieve headaches, try the following: Cool Compress. Lie down and place a cool compress on your head.  Avoid headache triggers. If certain foods or odors seem to have triggered your migraines in the past, avoid them. A headache diary might help you identify triggers.  Include physical activity in your daily routine. Try a daily walk or other moderate aerobic exercise.  Manage stress. Find healthy ways to cope with the stressors, such as delegating tasks on your to-do list.  Practice relaxation techniques. Try deep breathing, yoga, massage and visualization.  Eat regularly. Eating regularly scheduled meals and maintaining a healthy diet might help prevent headaches. Also, drink plenty of fluids.  Follow a regular sleep schedule. Sleep deprivation might contribute to headaches Consider biofeedback. With this mind-body technique, you learn to control certain bodily functions -- such as muscle tension, heart rate and blood pressure -- to prevent headaches or reduce headache pain.    Proceed to emergency room if you experience new or worsening symptoms or symptoms do not resolve, if you have new neurologic symptoms or if headache is severe, or for any concerning symptom.   Provided education and documentation from American headache Society toolbox including  articles on: chronic migraine medication overuse headache, chronic migraines, prevention of migraines, behavioral and other nonpharmacologic treatments for headache.    No orders of the defined types were placed in this encounter.  Meds ordered this encounter  Medications   DISCONTD: ondansetron (ZOFRAN-ODT) 4 MG disintegrating tablet    Sig: Take 1-2 tablets (4-8 mg total) by mouth every 8 (eight) hours as needed.    Dispense:  30 tablet    Refill:  3   Rimegepant Sulfate (NURTEC) 75 MG TBDP    Sig: Take 1 tablet (75 mg total) by mouth daily as needed. For migraines. Take as close to onset of migraine as possible. One daily maximum.    Dispense:  10 tablet    Refill:  6   Ubrogepant (UBRELVY) 100 MG TABS    Sig: Take 1 tablet (100 mg total) by mouth every 2 (two) hours as needed. Maximum 200mg  a day.    Dispense:  10 tablet    Refill:  0   Atogepant (QULIPTA) 60 MG TABS    Sig: Take 1 tablet (60 mg total) by mouth daily.    Dispense:  30 tablet    Refill:  0   ondansetron (ZOFRAN-ODT) 4 MG disintegrating tablet    Sig: Take 1-2 tablets (4-8 mg total) by mouth every 8 (eight) hours as needed.    Dispense:  30 tablet    Refill:  3    Cc: Marcelle Overlie, MD,    Naomie Dean, MD  [facility] [address] [place], [place] [address]  Phone [phone] Fax [phone]  I spent 30 minutes of face-to-face and non-face-to-face time with patient on the  1. Migraine without aura and without status migrainosus, not intractable  diagnosis.  This included previsit chart review, lab review, study review, order entry, electronic health record documentation, patient education on the different diagnostic and therapeutic options, counseling and coordination of care, risks and benefits of management, compliance, or risk factor reduction

## 2022-06-07 NOTE — Patient Instructions (Addendum)
Prevention: Start qulipta daily preventative 60mg  pill can cut in half Acutely: Nurtec or Ubrelvy right as soon as needed. Nurtec daily. Bernita Raisin can repeat in 2 hours.  Nurtec: super long half life, you can take IF you even think you are going to have a migraine  No more triptans   Rimegepant Disintegrating Tablets What is this medication? RIMEGEPANT (ri ME je pant) prevents and treats migraines. It works by blocking a substance in the body that causes migraines. This medicine may be used for other purposes; ask your health care provider or pharmacist if you have questions. COMMON BRAND NAME(S): NURTEC ODT What should I tell my care team before I take this medication? They need to know if you have any of these conditions: Kidney disease Liver disease An unusual or allergic reaction to rimegepant, other medications, foods, dyes, or preservatives Pregnant or trying to get pregnant Breast-feeding How should I use this medication? Take this medication by mouth. Take it as directed on the prescription label. Leave the tablet in the sealed pack until you are ready to take it. With dry hands, open the pack and gently remove the tablet. If the tablet breaks or crumbles, throw it away. Use a new tablet. Place the tablet in the mouth and allow it to dissolve. Then, swallow it. Do not cut, crush, or chew this medication. You do not need water to take this medication. Talk to your care team about the use of this medication in children. Special care may be needed. Overdosage: If you think you have taken too much of this medicine contact a poison control center or emergency room at once. NOTE: This medicine is only for you. Do not share this medicine with others. What if I miss a dose? This does not apply. This medication is not for regular use. What may interact with this medication? Certain medications for fungal infections, such as fluconazole, itraconazole Rifampin This list may not describe all  possible interactions. Give your health care provider a list of all the medicines, herbs, non-prescription drugs, or dietary supplements you use. Also tell them if you smoke, drink alcohol, or use illegal drugs. Some items may interact with your medicine. What should I watch for while using this medication? Visit your care team for regular checks on your progress. Tell your care team if your symptoms do not start to get better or if they get worse. What side effects may I notice from receiving this medication? Side effects that you should report to your care team as soon as possible: Allergic reactions--skin rash, itching, hives, swelling of the face, lips, tongue, or throat Side effects that usually do not require medical attention (report to your care team if they continue or are bothersome): Nausea Stomach pain This list may not describe all possible side effects. Call your doctor for medical advice about side effects. You may report side effects to FDA at 1-800-FDA-1088. Where should I keep my medication? Keep out of the reach of children and pets. Store at room temperature between 20 and 25 degrees C (68 and 77 degrees F). Get rid of any unused medication after the expiration date. To get rid of medications that are no longer needed or have expired: Take the medication to a medication take-back program. Check with your pharmacy or law enforcement to find a location. If you cannot return the medication, check the label or package insert to see if the medication should be thrown out in the garbage or flushed down the toilet.  If you are not sure, ask your care team. If it is safe to put it in the trash, take the medication out of the container. Mix the medication with cat litter, dirt, coffee grounds, or other unwanted substance. Seal the mixture in a bag or container. Put it in the trash. NOTE: This sheet is a summary. It may not cover all possible information. If you have questions about this  medicine, talk to your doctor, pharmacist, or health care provider.  2023 Elsevier/Gold Standard (2021-02-24 00:00:00) Ubrogepant Tablets What is this medication? UBROGEPANT (ue BROE je pant) treats migraines. It works by blocking a substance in the body that causes migraines. It is not used to prevent migraines. This medicine may be used for other purposes; ask your health care provider or pharmacist if you have questions. COMMON BRAND NAME(S): Bernita Raisin What should I tell my care team before I take this medication? They need to know if you have any of these conditions: Kidney disease Liver disease An unusual or allergic reaction to ubrogepant, other medications, foods, dyes, or preservatives Pregnant or trying to get pregnant Breast-feeding How should I use this medication? Take this medication by mouth with a glass of water. Take it as directed on the prescription label. You can take it with or without food. If it upsets your stomach, take it with food. Keep taking it unless your care team tells you to stop. Talk to your care team about the use of this medication in children. Special care may be needed. Overdosage: If you think you have taken too much of this medicine contact a poison control center or emergency room at once. NOTE: This medicine is only for you. Do not share this medicine with others. What if I miss a dose? This does not apply. This medication is not for regular use. What may interact with this medication? Do not take this medication with any of the following: Adagrasib Ceritinib Certain antibiotics, such as chloramphenicol, clarithromycin, telithromycin Certain antivirals for HIV, such as atazanavir, cobicistat, darunavir, delavirdine, fosamprenavir, indinavir, ritonavir Certain medications for fungal infections, such as itraconazole, ketoconazole, posaconazole, voriconazole Conivaptan Grapefruit Idelalisib Mifepristone Nefazodone Ribociclib This medication may  also interact with the following: Carvedilol Certain medications for seizures, such as phenobarbital, phenytoin Ciprofloxacin Cyclosporine Eltrombopag Fluconazole Fluvoxamine Quinidine Rifampin St. John's wort Verapamil This list may not describe all possible interactions. Give your health care provider a list of all the medicines, herbs, non-prescription drugs, or dietary supplements you use. Also tell them if you smoke, drink alcohol, or use illegal drugs. Some items may interact with your medicine. What should I watch for while using this medication? Visit your care team for regular checks on your progress. Tell your care team if your symptoms do not start to get better or if they get worse. Your mouth may get dry. Chewing sugarless gum or sucking hard candy and drinking plenty of water may help. Contact your care team if the problem does not go away or is severe. What side effects may I notice from receiving this medication? Side effects that you should report to your care team as soon as possible: Allergic reactions--skin rash, itching, hives, swelling of the face, lips, tongue, or throat Side effects that usually do not require medical attention (report to your care team if they continue or are bothersome): Drowsiness Dry mouth Fatigue Nausea This list may not describe all possible side effects. Call your doctor for medical advice about side effects. You may report side effects to FDA  at 1-800-FDA-1088. Where should I keep my medication? Keep out of the reach of children and pets. Store between 15 and 30 degrees C (59 and 86 degrees F). Get rid of any unused medication after the expiration date. To get rid of medications that are no longer needed or have expired: Take the medication to a medication take-back program. Check with your pharmacy or law enforcement to find a location. If you cannot return the medication, check the label or package insert to see if the medication should  be thrown out in the garbage or flushed down the toilet. If you are not sure, ask your care team. If it is safe to put it in the trash, pour the medication out of the container. Mix the medication with cat litter, dirt, coffee grounds, or other unwanted substance. Seal the mixture in a bag or container. Put it in the trash. NOTE: This sheet is a summary. It may not cover all possible information. If you have questions about this medicine, talk to your doctor, pharmacist, or health care provider.  2023 Elsevier/Gold Standard (2021-02-11 00:00:00) Atogepant Tablets What is this medication? ATOGEPANT (a TOE je pant) prevents migraines. It works by blocking a substance in the body that causes migraines. This medicine may be used for other purposes; ask your health care provider or pharmacist if you have questions. COMMON BRAND NAME(S): QULIPTA What should I tell my care team before I take this medication? They need to know if you have any of these conditions: Kidney disease Liver disease An unusual or allergic reaction to atogepant, other medications, foods, dyes, or preservatives Pregnant or trying to get pregnant Breast-feeding How should I use this medication? Take this medication by mouth with water. Take it as directed on the prescription label at the same time every day. You can take it with or without food. If it upsets your stomach, take it with food. Keep taking it unless your care team tells you to stop. Talk to your care team about the use of this medication in children. Special care may be needed. Overdosage: If you think you have taken too much of this medicine contact a poison control center or emergency room at once. NOTE: This medicine is only for you. Do not share this medicine with others. What if I miss a dose? If you miss a dose, take it as soon as you can. If it is almost time for your next dose, take only that dose. Do not take double or extra doses. What may interact with  this medication? Carbamazepine Certain medications for fungal infections, such as itraconazole, ketoconazole Clarithromycin Cyclosporine Efavirenz Etravirine Phenytoin Rifampin St. John's wort This list may not describe all possible interactions. Give your health care provider a list of all the medicines, herbs, non-prescription drugs, or dietary supplements you use. Also tell them if you smoke, drink alcohol, or use illegal drugs. Some items may interact with your medicine. What should I watch for while using this medication? Visit your care team for regular checks on your progress. Tell your care team if your symptoms do not start to get better or if they get worse. What side effects may I notice from receiving this medication? Side effects that you should report to your care team as soon as possible: Allergic reactions--skin rash, itching, hives, swelling of the face, lips, tongue, or throat Side effects that usually do not require medical attention (report to your care team if they continue or are bothersome): Constipation Fatigue Loss of  appetite with weight loss Nausea This list may not describe all possible side effects. Call your doctor for medical advice about side effects. You may report side effects to FDA at 1-800-FDA-1088. Where should I keep my medication? Keep out of the reach of children and pets. Store at room temperature between 20 and 25 degrees C (68 and 77 degrees F). Get rid of any unused medication after the expiration date. To get rid of medications that are no longer needed or have expired: Take the medication to a medication take-back program. Check with your pharmacy or law enforcement to find a location. If you cannot return the medication, check the label or package insert to see if the medication should be thrown out in the garbage or flushed down the toilet. If you are not sure, ask your care team. If it is safe to put it in the trash, take the medication out  of the container. Mix the medication with cat litter, dirt, coffee grounds, or other unwanted substance. Seal the mixture in a bag or container. Put it in the trash. NOTE: This sheet is a summary. It may not cover all possible information. If you have questions about this medicine, talk to your doctor, pharmacist, or health care provider.  2023 Elsevier/Gold Standard (2021-01-19 00:00:00)

## 2022-07-16 ENCOUNTER — Encounter: Payer: Self-pay | Admitting: Neurology

## 2022-07-18 MED ORDER — QULIPTA 60 MG PO TABS: 60.0000 mg | ORAL_TABLET | Freq: Every day | ORAL | 11 refills | Status: DC

## 2022-07-18 NOTE — Addendum Note (Signed)
Addended by: Naomie Dean B on: 07/18/2022 06:27 PM   Modules accepted: Orders

## 2022-07-20 ENCOUNTER — Other Ambulatory Visit (HOSPITAL_COMMUNITY): Payer: Self-pay

## 2022-07-20 ENCOUNTER — Telehealth: Payer: Self-pay

## 2022-07-20 NOTE — Telephone Encounter (Signed)
Pharmacy Patient Advocate Encounter   Received notification from [facility] that prior authorization for Qulipta 60MG  tablets is required/requested.   PA submitted to [facility] via CoverMyMeds Key or (Medicaid) confirmation # [id number] Status is pending

## 2022-07-25 NOTE — Telephone Encounter (Signed)
Pharmacy Patient Advocate Encounter  Received notification from [facility] that the request for prior authorization for Qulipta 60MG  tablets has been denied due to See below.      Please be advised we currently do not have a Pharmacist to review denials, therefore you will need to process appeals accordingly as needed. Thanks for your support at this time.   You may fax [phone], to appeal.  The denial letter has been placed in the chart under the media tab.  There is also an Eappeal option on CMM.

## 2022-07-25 NOTE — Telephone Encounter (Signed)
Can you confirm if the PA is being done for episodic migraine prevention vs chronic migraine? All of those other CGRPs are only prescribed for chronic migraine but hers is episodic so technically Emgality wouldn't be approved for that.

## 2022-07-26 NOTE — Telephone Encounter (Signed)
Hey she uses G43.009 for episodic migraine. its on her last office note.

## 2022-07-26 NOTE — Telephone Encounter (Signed)
I went by the DX code and I also did not find episodic migraine in chart except from an outside visit-I did choose Chronic Migraine-I will resubmit as episodic. I Googled the ICD code as G44.219 as Episodic Tension-type headache, not intractable. Would this be correct?

## 2022-07-26 NOTE — Telephone Encounter (Signed)
Pharmacy Patient Advocate Encounter   Received notification from [facility] that prior authorization for Qulipta 60MG  tablets is required/requested.   PA submitted to [facility] via CoverMyMeds Key or (Medicaid) confirmation # [id number] Status is pending

## 2022-08-01 NOTE — Telephone Encounter (Signed)
Resubmitted but was still denied-

## 2022-08-03 NOTE — Telephone Encounter (Signed)
The denial letter has been placed in the chart under the media tab.

## 2022-08-04 ENCOUNTER — Other Ambulatory Visit (HOSPITAL_COMMUNITY): Payer: Self-pay

## 2022-08-04 NOTE — Telephone Encounter (Signed)
Gina Cantrell was denied, insurance is wanting her to try [employer].

## 2022-08-04 NOTE — Telephone Encounter (Signed)
Can we write an appeal? She had a skin reaction to ajovy so I would hate to try emgality.  Is qulipta just not covered or does she have to try/fail emgality first and then they will cover it?    Also, why does this from 7/3 say medicaid she is BC/BS?  Received notification from [facility] that prior authorization for Qulipta 60MG  tablets is required/requested.   PA submitted to [facility] via CoverMyMeds Key or (Medicaid) confirmation # [id number] Status is pending   thanks

## 2022-08-08 ENCOUNTER — Encounter: Payer: Self-pay | Admitting: *Deleted

## 2022-08-08 ENCOUNTER — Other Ambulatory Visit (HOSPITAL_COMMUNITY): Payer: Self-pay

## 2022-08-08 NOTE — Telephone Encounter (Signed)
Completed the appeal template and faxed along with clinicals to [phone]. Awaiting determination.

## 2022-08-08 NOTE — Telephone Encounter (Signed)
I updated the pt.

## 2022-08-17 NOTE — Telephone Encounter (Signed)
  I contacted the pt to see if she has received the form yet to complete.

## 2022-08-22 NOTE — Telephone Encounter (Signed)
Called pt & LVM (ok per DPR) advising [employer] mailed her a form to complete that should allow [place] to appeal Qulipta. I asked for call back.

## 2022-08-23 NOTE — Telephone Encounter (Signed)
Patient left a voicemail regarding this message. She advised that [employer] did send her a form which she completed and faxed back to them last Wednesday, 7/31. She is asking for a call to discuss whether or not she should reach out to [facility].

## 2022-08-23 NOTE — Telephone Encounter (Signed)
We will handle in other phone note

## 2022-08-25 NOTE — Telephone Encounter (Signed)
Tried to call [employer] Appeals-hold time was est 52 mins-will attempt to try again.

## 2022-08-30 NOTE — Telephone Encounter (Signed)
Called [employer] to follow up and they stated the appeal is still in progress, states they have 30 days to make a determination-they have until Aug 30th-they state they did not receive the appeal until July 31st-yet it was faxed on 08/08/2022.   Call Reference: [email or web address]. 08/30/2022 8:46Am EST

## 2022-09-27 ENCOUNTER — Other Ambulatory Visit (HOSPITAL_COMMUNITY): Payer: Self-pay

## 2022-09-27 NOTE — Telephone Encounter (Addendum)
I called insurance [employer] appeals department [phone]) to follow up on the appeals decision-they stated the previous appeal was not accepted that was submitted in July-they state another one was submitted on 09/13/2022 Appeal #[id number] state this appeal was also denied due to not medically necessary. I am trying to get a copy of the appeal denial faxed to us-have been on phone with [employer] for (was disconnected by [employer] twice).  Call Reference: Priscilla L. 12:17PM EST  I was disconnected again and transferred once again trying to get a copy of the appeal denial letter faxed over-they state they would send a request with the fax number of [phone].  25 Min call Reference# [id number]

## 2022-09-28 NOTE — Telephone Encounter (Signed)
Ok I sent pt a Clinical cytogeneticist message with an update. Let us know once you receive the denial letter so we can discuss alternatives with Dr Lucia Gaskins.

## 2022-10-31 NOTE — Telephone Encounter (Signed)
Dr Lucia Gaskins, Do you have another recommendation besides Qulipta? We are unable to get it approved even with doing an appeal. We haven't been able to receive a detailed denial letter from insurance.

## 2022-11-01 NOTE — Telephone Encounter (Signed)
Ok great, thanks! [place], [place] hold for now.

## 2022-11-01 NOTE — Telephone Encounter (Signed)
I spoke to her, seems her migrianes are improved I will let you know after I call her and talk in detail thanks!

## 2022-11-02 NOTE — Telephone Encounter (Signed)
I spoke with patient and her headaches have improved at this time will hold off thank you

## 2022-11-18 MED ORDER — QULIPTA 60 MG PO TABS: 60.0000 mg | ORAL_TABLET | ORAL | Status: AC | PRN

## 2022-11-18 NOTE — Telephone Encounter (Signed)
"  Gina Cantrell needs samples" per message from Dr. Lucia Gaskins, samples were left at the front for pt to pick up

## 2022-11-18 NOTE — Addendum Note (Signed)
Addended by: Deatra James on: 11/18/2022 08:19 AM   Modules accepted: Orders

## 2022-11-21 ENCOUNTER — Other Ambulatory Visit: Payer: Self-pay | Admitting: Neurology

## 2022-11-21 DIAGNOSIS — G43009 Migraine without aura, not intractable, without status migrainosus: Secondary | ICD-10-CM

## 2022-11-21 MED ORDER — QULIPTA 60 MG PO TABS: 60.0000 mg | ORAL_TABLET | Freq: Every day | ORAL | 11 refills | Status: DC

## 2022-12-06 ENCOUNTER — Telehealth (INDEPENDENT_AMBULATORY_CARE_PROVIDER_SITE_OTHER): Payer: BC Managed Care – PPO | Admitting: Neurology

## 2022-12-06 DIAGNOSIS — G43711 Chronic migraine without aura, intractable, with status migrainosus: Secondary | ICD-10-CM

## 2022-12-11 ENCOUNTER — Encounter: Payer: Self-pay | Admitting: Neurology

## 2022-12-11 DIAGNOSIS — G43711 Chronic migraine without aura, intractable, with status migrainosus: Secondary | ICD-10-CM | POA: Insufficient documentation

## 2022-12-11 NOTE — Progress Notes (Signed)
[facility]    Provider:  Dr Lucia Gaskins Requesting Provider: Self Referred Primary Care Provider:  Melida Quitter, MD  CC:  migraines  Virtual Visit via Video Note  I connected with Gina Cantrell on 12/06/2022 at 11:00 AM EST by a video enabled telemedicine application and verified that I am speaking with the correct person using two identifiers.  Location: Patient: home Provider: office   I discussed the limitations of evaluation and management by telemedicine and the availability of in person appointments. The patient expressed understanding and agreed to proceed.   Follow Up Instructions:    I discussed the assessment and treatment plan with the patient. The patient was provided an opportunity to ask questions and all were answered. The patient agreed with the plan and demonstrated an understanding of the instructions.   The patient was advised to call back or seek an in-person evaluation if the symptoms worsen or if the condition fails to improve as anticipated.  I provided 30 minutes of non-face-to-face time during this encounter.   Anson Fret, MD     12/11/2022: Patient with 30 migraine days a month for the > 90 days. Qulipta helped reduce by 50% but still having 15 migraine days a month and now that we cannot get qulipta approved, and she has chronic migraines w/o aura, will try botox for migraines.  Patient has unilateral migraines, triggers are perfumes and orders, she has motion sickness, phonophobia, photophobia and osmophobia.  The migraines have worsened over the last 5 months and for the last 3 months or more she has had daily moderate to severe migraines that can last 12 to 24 hours, pulsating pounding throbbing, hurts to move, no medication overuse, no aura.  She has tried and failed multiple medications.  Qulipta in the past was working well greater than 50% improvement but was still having at least 15 migraine days a month.  We tried to  get Bennie Pierini approved nothing is helping with that, not even heard back from her insurance company, even signed her up for the co-pay.  In any case with her chronic migraines I think she would be a better candidate for Botox for migraines given the severity and the persistence.  We will get her approved for Botox for migraines.  Was doing well on Qulipta > 50% iumprovement in migraine freq and severity. Tryoing to get it approved again failed. Even with COPAY CARD: BIN [id number] PCN OHCP GRP [id number] ID [id number].   Medications Tried > 3 months: tried Ajovy, aimovig is contraindicated due to constipation, Emgality, BP meds contraindicated due to hypotension but tried propranolol in the past(had hypotension),tried  topiramate, tried nortriptyline(side effects sedation), tried imitrex, tried rizatriptan, tried gabapentin, tried propranolol with hypotention, did well on Qulipta samples but still had a burden of headaches  Patient complains of symptoms per HPI as well as the following symptoms: none . Pertinent negatives and positives per HPI. All others negative   06/07/2022; She has triggers. Perfume and odors. She feels it is related to peri-menopause. Motion sickness when not driving. Heat are triggers. 5 migraine days a month, < 10 total headache days a month. She has photophobia as well and light can trigger, nausea, lightheadedness, starts on the right side behind the eye, pulsating, pounding, throbbing, moderately severe to severe, its debilitating, she also has osmophobia as well but not as bad, going to a dark room and laying down helps, worsening especially the osmophobia  Patient complains of symptoms per HPI as  well as the following symptoms: none . Pertinent negatives and positives per HPI. All others negative  HPI 06/13/2019:  Gina Cantrell is a 42 y.o. female here for migraines. PMHx migraine, gerd, asthma, anxiety, anemia, adhd. Migraines worsening. Smells really trigger migraines. She  has had headaches started 4 years ago, no known family history of headaches. She has photophobia as well and light can trigger, nausea, lightheadedness, starts on the right side behind the eye, pulsating, pounding, throbbing, moderately severe to severe, its debilitating, she also has phonophobia as well but not as bad, going to a dark room and laying down helps, worsening especially the osmophobia. 15 headache days a month more so with heat or sun, 8 migraine days a month. The migraines last 4+ hours, better with excedrin but increasing use. She can wake up with the headaches and she is reporting vision changes/blurry vision with the migraines, they are worse positionally and she has to stay still, worsening in frequency and severity. She has never had brain imaging before. No other focal neurologic deficits, associated symptoms, inciting events or modifiable factors.  06/27/2019:  IMPRESSION: This MRI of the brain without contrast shows the following: 1.   The pituitary gland is thinned within a mildly enlarged sella turcica.  This is usually an incidental finding, especially since optic nerve sheaths are normal in diameter, but could also be seen with elevated intracranial pressure.   2.   The brain is otherwise normal. 3.   Normal enhancement pattern and no acute findings.  Review of Systems: Patient complains of symptoms per HPI as well as the following symptoms: adhd. Pertinent negatives and positives per HPI. All others negative.  Medications Tried > 3 months: tried Ajovy, aimovig is contraindicated due to constipation, Emgality, BP meds contraindicated due to hypotension,tried  topiramate, tried nortriptyline(side effects sedation), tried imitrex, tried rizatriptan, tried gabapentin, tried propranolol with hjypotention   Social History   Socioeconomic History   Marital status: Married    Spouse name: Not on file   Number of children: 2   Years of education: Not on file   Highest education  level: Doctorate  Occupational History   Occupation: Endodonist  Tobacco Use   Smoking status: Former    Current packs/day: 0.00    Types: Cigarettes    Start date: 08/31/1999    Quit date: 08/31/2006    Years since quitting: 16.2   Smokeless tobacco: Never  Vaping Use   Vaping status: Never Used  Substance and Sexual Activity   Alcohol use: Yes    Alcohol/week: 2.0 standard drinks of alcohol    Types: 2 Glasses of wine per week   Drug use: No   Sexual activity: Yes    Partners: Male    Birth control/protection: I.U.D.  Other Topics Concern   Not on file  Social History Narrative   Lives at home with husband and kids   Works at [employer]   Right handed   Caffeine: coffee, diet coke (1 per day while working)   International aid/development worker of [employer] Strain: Not on [employer] Insecurity: Not on file  Transportation Needs: Not on file  Physical Activity: Not on file  Stress: Not on file  Social Connections: Not on file  Intimate Partner Violence: Not on file    Family History  Problem Relation Age of Onset   Heart disease Father    Depression Father    Ankylosing spondylitis Sister  this is patients twin   Colon cancer Neg Hx    Migraines Neg Hx    Headache Neg Hx     Past Medical History:  Diagnosis Date   ADHD    Anemia    Anxiety    Asthma    GERD (gastroesophageal reflux disease)    Migraine    Urinary tract infection    freq.    Patient Active Problem List   Diagnosis Date Noted   Chronic migraine without aura, with intractable migraine, so stated, with status migrainosus 12/11/2022   Migraine without aura and without status migrainosus, not intractable 06/13/2019   Screening for cardiovascular condition 08/31/2015   S/P cesarean section 10/18/2010    Past Surgical History:  Procedure Laterality Date   CESAREAN SECTION  2009   CESAREAN SECTION  10/18/2010   Procedure: CESAREAN SECTION;  Surgeon: Jeani Hawking,  MD;  Location: [facility];  Service: Gynecology;  Laterality: N/A;  Repeat (request case for Avra,Wanda, Colleen Dr. Rodman Pickle))   INTRAUTERINE DEVICE INSERTION      Current Outpatient Medications  Medication Sig Dispense Refill   Aspirin-Acetaminophen-Caffeine (EXCEDRIN MIGRAINE PO) Take by mouth as needed.     Atogepant (QULIPTA) 60 MG TABS Take 1 tablet (60 mg total) by mouth as needed.     Atogepant (QULIPTA) 60 MG TABS Take 1 tablet (60 mg total) by mouth daily. PLEASE USE COPAY CARD: BIN [id number] PCN OHCP GRP [id number] ID [id number] 30 tablet 11   lisdexamfetamine (VYVANSE) 70 MG capsule Take 70 mg by mouth daily.     ondansetron (ZOFRAN-ODT) 4 MG disintegrating tablet Take 1-2 tablets (4-8 mg total) by mouth every 8 (eight) hours as needed. 30 tablet 3   Rimegepant Sulfate (NURTEC) 75 MG TBDP Take 1 tablet (75 mg total) by mouth daily as needed. For migraines. Take as close to onset of migraine as possible. One daily maximum. 10 tablet 6   rizatriptan (MAXALT-MLT) 10 MG disintegrating tablet Take 1 tablet (10 mg total) by mouth as needed for migraine. May repeat in 2 hours if needed (Patient not taking: Reported on 06/07/2022) 9 tablet 11   Ubrogepant (UBRELVY) 100 MG TABS Take 1 tablet (100 mg total) by mouth every 2 (two) hours as needed. Maximum 200mg  a day. 10 tablet 0   No current facility-administered medications for this visit.    Allergies as of 12/06/2022 - Review Complete 06/07/2022  Allergen Reaction Noted   Sulfamethoxazole-trimethoprim Nausea Only 07/22/2019   Tobrex [tobramycin sulfate]  10/08/2010    Vitals: There were no vitals taken for this visit. Last Weight:  Wt Readings from Last 1 Encounters:  06/07/22 125 lb 6.4 oz (56.9 kg)   Last Height:   Ht Readings from Last 1 Encounters:  06/07/22 5\' 6"  (1.676 m)    Physical exam: Exam: Gen: NAD, conversant      CV: No palpitations or chest pain or SOB. VS: Breathing at a normal rate. Weight appears within normal  limits. Not febrile. Eyes: Conjunctivae clear without exudates or hemorrhage  Neuro: Detailed Neurologic Exam  Speech:    Speech is normal; fluent and spontaneous with normal comprehension.  Cognition:    The patient is oriented to person, place, and time;     recent and remote memory intact;     language fluent;     normal attention, concentration, fund of knowledge Cranial Nerves:    The pupils are equal, round, and reactive to light. Visual fields are full Extraocular movements are  intact.  The face is symmetric with normal sensation. The palate elevates in the midline. Hearing intact. Voice is normal. Shoulder shrug is normal. The tongue has normal motion without fasciculations.   Coordination: normal  Gait:    No abnormalities noted or reported  Motor Observation:   no involuntary movements noted. Tone:    Appears normal  Posture:    Posture is normal. normal erect    Strength:    Strength is anti-gravity and symmetric in the upper and lower limbs.      Sensation: intact to LT, no reports of numbness or tingling or paresthesias       Assessment/Plan:  Patient with chronic migraines. We had a long discussion about migraine, pathophysiology, natural progression, lifestyle factors and triggers. We thoroughly reviewed options for prevention and acute management again.    Patient with 30 migraine days a month for the last > 90 days. Qulipta helped reduce by 50% but still having 15 migraine days a month and now that we cannot get qulipta approved she is back to 30/30 migraine days a month that are moderate to severe, and she has chronic migraines w/o aura, will try botox for migraines.  Patient has unilateral migraines, triggers are perfumes and orders, she has motion sickness, phonophobia, photophobia and osmophobia.  The migraines have worsened over the last 5 months and for the last 3 months or more she has had moderate tpo to severe daily migraines that can last 12 to 24  hours, pulsating pounding throbbing, hurts to move, no medication overuse, no aura.  She has tried and failed multiple medications.  Qulipta in the past was working well greater than 50% improvement but was still having at least 15 migraine days a month.  We tried to get Bennie Pierini approved nothing is helping with that, not even heard back from her insurance company, even signed her up for the co-pay.  In any case with her chronic migraines I think she would be a better candidate for Botox for migraines given the severity and the persistence.  We will get her approved for Botox for migraines.  Was doing well on Qulipta 50% imorovement but that still left her with a burden of 15 migraine days a month moderate to severe, no aura, no medication overuse, Tryoing to get it approved again failed. Even with COPAY CARD: BIN [id number] PCN OHCP GRP [id number] ID [id number]  Medications Tried > 3 months: tried Ajovy, aimovig is contraindicated due to constipation, Emgality, BP meds contraindicated due to hypotension but tried propranolol in the past(had hypotension),tried  topiramate, tried nortriptyline(side effects sedation), tried imitrex, tried rizatriptan, tried gabapentin, tried propranolol with hypotention, did well on Qulipta samples but still had a burden of headache.  No more triptans, chest pain, contraindicated  PLEASE START BOTOX and schedule her in February with Dr. 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  No orders of the defined types were placed in this encounter.      Discussed: To prevent or relieve headaches, try the following: Cool Compress. Lie down and place a cool compress on your head.  Avoid headache triggers. If certain foods or odors seem to have triggered your migraines in the past, avoid them. A  headache diary might help you identify triggers.  Include physical activity  in your daily routine. Try a daily walk or other moderate aerobic exercise.  Manage stress. Find healthy ways to cope with the stressors, such as delegating tasks on your to-do list.  Practice relaxation techniques. Try deep breathing, yoga, massage and visualization.  Eat regularly. Eating regularly scheduled meals and maintaining a healthy diet might help prevent headaches. Also, drink plenty of fluids.  Follow a regular sleep schedule. Sleep deprivation might contribute to headaches Consider biofeedback. With this mind-body technique, you learn to control certain bodily functions -- such as muscle tension, heart rate and blood pressure -- to prevent headaches or reduce headache pain.    Proceed to emergency room if you experience new or worsening symptoms or symptoms do not resolve, if you have new neurologic symptoms or if headache is severe, or for any concerning symptom.   Provided education and documentation from American headache Society toolbox including articles on: chronic migraine medication overuse headache, chronic migraines, prevention of migraines, behavioral and other nonpharmacologic treatments for headache.    No orders of the defined types were placed in this encounter.  No orders of the defined types were placed in this encounter.   Cc: Melida Quitter, MD,    Naomie Dean, MD  [facility] [address] [place], [place] [address]  Phone [phone] Fax [phone]  I spent 30 minutes of face-to-face and non-face-to-face time with patient on the  1. Chronic migraine without aura, with intractable migraine, so stated, with status migrainosus      diagnosis.  This included previsit chart review, lab review, study review, order entry, electronic health record documentation, patient education on the different diagnostic and therapeutic options, counseling and  coordination of care, risks and benefits of management, compliance, or risk factor reduction

## 2022-12-12 ENCOUNTER — Telehealth: Payer: Self-pay | Admitting: *Deleted

## 2022-12-12 NOTE — Telephone Encounter (Signed)
Completed [employer] PA form and placed in nurse pod for MD signature.

## 2022-12-12 NOTE — Telephone Encounter (Signed)
Faxed signed PA form with OV notes to [facility] @ [phone].

## 2022-12-12 NOTE — Telephone Encounter (Signed)
Chronic Migraine CPT 64615    Botox J0585 Units 155 units   G43.711 Chronic Migraine without aura, intractable, with status migrainous

## 2022-12-20 MED ORDER — ONABOTULINUMTOXINA 200 UNITS IJ SOLR: INTRAMUSCULAR | 3 refills | Status: AC

## 2022-12-20 NOTE — Telephone Encounter (Addendum)
Received approval from [facility], please send rx to [facility].  Auth#: [id number] (12/12/22-05/29/23)

## 2022-12-20 NOTE — Addendum Note (Signed)
Addended by: Guy Begin on: 12/20/2022 08:39 AM   Modules accepted: Orders

## 2022-12-20 NOTE — Telephone Encounter (Signed)
Order placed to [facility].

## 2023-02-07 NOTE — Telephone Encounter (Signed)
Called pt to make sure she received message regarding appt. She said she received Botox on 1/15 so we rescheduled for 4/15. Informed her that [facility] would reach back out regarding shipment.

## 2023-02-23 ENCOUNTER — Ambulatory Visit: Payer: BC Managed Care – PPO | Admitting: Neurology

## 2023-05-02 ENCOUNTER — Ambulatory Visit: Payer: BC Managed Care – PPO | Admitting: Neurology

## 2023-05-02 VITALS — BP 112/71

## 2023-05-02 DIAGNOSIS — G43009 Migraine without aura, not intractable, without status migrainosus: Secondary | ICD-10-CM

## 2023-05-02 DIAGNOSIS — G43711 Chronic migraine without aura, intractable, with status migrainosus: Secondary | ICD-10-CM | POA: Diagnosis not present

## 2023-05-02 MED ORDER — ONABOTULINUMTOXINA 200 UNITS IJ SOLR
155.0000 [IU] | Freq: Once | INTRAMUSCULAR | Status: AC
  Administered 2023-05-02: 155 [IU] via INTRAMUSCULAR

## 2023-05-02 NOTE — Progress Notes (Signed)
 Botox- 200 units x 1 vial Lot: [id number] Expiration: 04/2025 NDC: 6213-0865-78  Bacteriostatic 0.9% Sodium Chloride- 4mL  Lot: [id number] Expiration: 11/18/2023 NDC: 5284-1324-40  Dx:  G43.711  S/P  Witnessed by blanca cma

## 2023-05-02 NOTE — Progress Notes (Signed)
 Consent Form Botulism Toxin Injection For Chronic Migraine  05/02/2023: first botox   Reviewed orally with patient, additionally signature is on file:  Botulism toxin has been approved by the Federal drug administration for treatment of chronic migraine. Botulism toxin does not cure chronic migraine and it may not be effective in some patients.  The administration of botulism toxin is accomplished by injecting a small amount of toxin into the muscles of the neck and head. Dosage must be titrated for each individual. Any benefits resulting from botulism toxin tend to wear off after 3 months with a repeat injection required if benefit is to be maintained. Injections are usually done every 3-4 months with maximum effect peak achieved by about 2 or 3 weeks. Botulism toxin is expensive and you should be sure of what costs you will incur resulting from the injection.  The side effects of botulism toxin use for chronic migraine may include:   -Transient, and usually mild, facial weakness with facial injections  -Transient, and usually mild, head or neck weakness with head/neck injections  -Reduction or loss of forehead facial animation due to forehead muscle weakness  -Eyelid drooping  -Dry eye  -Pain at the site of injection or bruising at the site of injection  -Double vision  -Potential unknown long term risks  Contraindications: You should not have Botox if you are pregnant, nursing, allergic to albumin, have an infection, skin condition, or muscle weakness at the site of the injection, or have myasthenia gravis, Lambert-Eaton syndrome, or ALS.  It is also possible that as with any injection, there may be an allergic reaction or no effect from the medication. Reduced effectiveness after repeated injections is sometimes seen and rarely infection at the injection site may occur. All care will be taken to prevent these side effects. If therapy is given over a long time, atrophy and wasting in the  muscle injected may occur. Occasionally the patient's become refractory to treatment because they develop antibodies to the toxin. In this event, therapy needs to be modified.  I have read the above information and consent to the administration of botulism toxin.    BOTOX PROCEDURE NOTE FOR MIGRAINE HEADACHE    Contraindications and precautions discussed with patient(above). Aseptic procedure was observed and patient tolerated procedure. Procedure performed by Dr. Criselda Dolly  The condition has existed for more than 6 months, and pt does not have a diagnosis of ALS, Myasthenia Gravis or Lambert-Eaton Syndrome.  Risks and benefits of injections discussed and pt agrees to proceed with the procedure.  Written consent obtained  These injections are medically necessary. Pt  receives good benefits from these injections. These injections do not cause sedations or hallucinations which the oral therapies may cause.  Description of procedure:  The patient was placed in a sitting position. The standard protocol was used for Botox as follows, with 5 units of Botox injected at each site:   -Procerus muscle, midline injection  -Corrugator muscle, bilateral injection  -Frontalis muscle, bilateral injection, with 2 sites each side, medial injection was performed in the upper one third of the frontalis muscle, in the region vertical from the medial inferior edge of the superior orbital rim. The lateral injection was again in the upper one third of the forehead vertically above the lateral limbus of the cornea, 1.5 cm lateral to the medial injection site.  -Temporalis muscle injection, 4 sites, bilaterally. The first injection was 3 cm above the tragus of the ear, second injection site was 1.5  cm to 3 cm up from the first injection site in line with the tragus of the ear. The third injection site was 1.5-3 cm forward between the first 2 injection sites. The fourth injection site was 1.5 cm posterior to the  second injection site.   -Occipitalis muscle injection, 3 sites, bilaterally. The first injection was done one half way between the occipital protuberance and the tip of the mastoid process behind the ear. The second injection site was done lateral and superior to the first, 1 fingerbreadth from the first injection. The third injection site was 1 fingerbreadth superiorly and medially from the first injection site.  -Cervical paraspinal muscle injection, 2 sites, bilateral knee first injection site was 1 cm from the midline of the cervical spine, 3 cm inferior to the lower border of the occipital protuberance. The second injection site was 1.5 cm superiorly and laterally to the first injection site.  -Trapezius muscle injection was performed at 3 sites, bilaterally. The first injection site was in the upper trapezius muscle halfway between the inflection point of the neck, and the acromion. The second injection site was one half way between the acromion and the first injection site. The third injection was done between the first injection site and the inflection point of the neck.   Will return for repeat injection in 3 months.   200 units of Botox was used, any Botox not injected was wasted. The patient tolerated the procedure well, there were no complications of the above procedure.

## 2023-05-29 NOTE — Telephone Encounter (Signed)
 Current auth expired 05/29/23. Completed [employer] PA renewal form and placed in nurse pod for MD signature.

## 2023-05-31 NOTE — Telephone Encounter (Signed)
 Faxed signed PA form with notes to [phone].

## 2023-06-08 NOTE — Telephone Encounter (Addendum)
 Received approval from [facility], pt will continue to fill through [facility]. Auth#: [id number] (05/31/23-05/01/24)

## 2023-07-25 ENCOUNTER — Ambulatory Visit: Admitting: Neurology

## 2023-07-25 VITALS — BP 125/81 | HR 73

## 2023-07-25 DIAGNOSIS — G43711 Chronic migraine without aura, intractable, with status migrainosus: Secondary | ICD-10-CM | POA: Diagnosis not present

## 2023-07-25 DIAGNOSIS — G43009 Migraine without aura, not intractable, without status migrainosus: Secondary | ICD-10-CM

## 2023-07-25 MED ORDER — ONABOTULINUMTOXINA 200 UNITS IJ SOLR
155.0000 [IU] | Freq: Once | INTRAMUSCULAR | Status: AC
  Administered 2023-07-25: 155 [IU] via INTRAMUSCULAR

## 2023-07-25 NOTE — Progress Notes (Signed)
 Botox - 200 units x 1 vial Lot: [id number] Expiration: 11/2025 NDC: 9976-6078-97  Bacteriostatic 0.9% Sodium Chloride - 4 mL  Lot: [id number] Expiration: 11/16/2024 NDC: 9590-8033-97  Dx: H56.288 S/P  Witnessed by Nena GRADE RN Botox  consent signed

## 2023-07-25 NOTE — Progress Notes (Addendum)
 Consent Form Botulism Toxin Injection For Chronic Migraine  09/04/2023: Patient doing excellent on botox  now only 5 migraine days a month and < 8 total headache days a month. Will order ubrelvy . Failed imitrex and rizatriptan  > 3 months and ubrelvy  has helped tremendously.   07/25/2023: >>60% improvement in migraine and headache frequency and severity. Now with 4 migraine days a month and < 6 total headache days a month.  05/02/2023: first botox    Reviewed orally with patient, additionally signature is on file:  Botulism toxin has been approved by the Federal drug administration for treatment of chronic migraine. Botulism toxin does not cure chronic migraine and it may not be effective in some patients.  The administration of botulism toxin is accomplished by injecting a small amount of toxin into the muscles of the neck and head. Dosage must be titrated for each individual. Any benefits resulting from botulism toxin tend to wear off after 3 months with a repeat injection required if benefit is to be maintained. Injections are usually done every 3-4 months with maximum effect peak achieved by about 2 or 3 weeks. Botulism toxin is expensive and you should be sure of what costs you will incur resulting from the injection.  The side effects of botulism toxin use for chronic migraine may include:   -Transient, and usually mild, facial weakness with facial injections  -Transient, and usually mild, head or neck weakness with head/neck injections  -Reduction or loss of forehead facial animation due to forehead muscle weakness  -Eyelid drooping  -Dry eye  -Pain at the site of injection or bruising at the site of injection  -Double vision  -Potential unknown long term risks  Contraindications: You should not have Botox  if you are pregnant, nursing, allergic to albumin, have an infection, skin condition, or muscle weakness at the site of the injection, or have myasthenia gravis, Lambert-Eaton syndrome,  or ALS.  It is also possible that as with any injection, there may be an allergic reaction or no effect from the medication. Reduced effectiveness after repeated injections is sometimes seen and rarely infection at the injection site may occur. All care will be taken to prevent these side effects. If therapy is given over a long time, atrophy and wasting in the muscle injected may occur. Occasionally the patient's become refractory to treatment because they develop antibodies to the toxin. In this event, therapy needs to be modified.  I have read the above information and consent to the administration of botulism toxin.    BOTOX  PROCEDURE NOTE FOR MIGRAINE HEADACHE    Contraindications and precautions discussed with patient(above). Aseptic procedure was observed and patient tolerated procedure. Procedure performed by Dr. Andree Epp  The condition has existed for more than 6 months, and pt does not have a diagnosis of ALS, Myasthenia Gravis or Lambert-Eaton Syndrome.  Risks and benefits of injections discussed and pt agrees to proceed with the procedure.  Written consent obtained  These injections are medically necessary. Pt  receives good benefits from these injections. These injections do not cause sedations or hallucinations which the oral therapies may cause.  Description of procedure:  The patient was placed in a sitting position. The standard protocol was used for Botox  as follows, with 5 units of Botox  injected at each site:   -Procerus muscle, midline injection  -Corrugator muscle, bilateral injection  -Frontalis muscle, bilateral injection, with 2 sites each side, medial injection was performed in the upper one third of the frontalis muscle, in the region  vertical from the medial inferior edge of the superior orbital rim. The lateral injection was again in the upper one third of the forehead vertically above the lateral limbus of the cornea, 1.5 cm lateral to the medial injection  site.  -Temporalis muscle injection, 4 sites, bilaterally. The first injection was 3 cm above the tragus of the ear, second injection site was 1.5 cm to 3 cm up from the first injection site in line with the tragus of the ear. The third injection site was 1.5-3 cm forward between the first 2 injection sites. The fourth injection site was 1.5 cm posterior to the second injection site.   -Occipitalis muscle injection, 3 sites, bilaterally. The first injection was done one half way between the occipital protuberance and the tip of the mastoid process behind the ear. The second injection site was done lateral and superior to the first, 1 fingerbreadth from the first injection. The third injection site was 1 fingerbreadth superiorly and medially from the first injection site.  -Cervical paraspinal muscle injection, 2 sites, bilateral knee first injection site was 1 cm from the midline of the cervical spine, 3 cm inferior to the lower border of the occipital protuberance. The second injection site was 1.5 cm superiorly and laterally to the first injection site.  -Trapezius muscle injection was performed at 3 sites, bilaterally. The first injection site was in the upper trapezius muscle halfway between the inflection point of the neck, and the acromion. The second injection site was one half way between the acromion and the first injection site. The third injection was done between the first injection site and the inflection point of the neck.   Will return for repeat injection in 3 months.   200 units of Botox  was used, 45U Botox  not injected was wasted. The patient tolerated the procedure well, there were no complications of the above procedure.

## 2023-09-04 MED ORDER — UBRELVY 100 MG PO TABS: 100.0000 mg | ORAL_TABLET | ORAL | 11 refills | Status: AC | PRN

## 2023-09-04 NOTE — Addendum Note (Signed)
 Addended by: Mikhai Bienvenue B on: 09/04/2023 04:38 PM   Modules accepted: Orders

## 2023-09-05 ENCOUNTER — Telehealth: Payer: Self-pay | Admitting: Pharmacist

## 2023-09-05 NOTE — Telephone Encounter (Signed)
 Pharmacy Patient Advocate Encounter  Received notification from [facility] that Prior Authorization for Ubrelvy  100MG  tablets  has been APPROVED from 09/05/2023 to 11/28/2023   PA #/Case ID/Reference #: [id number]

## 2023-09-05 NOTE — Telephone Encounter (Signed)
 Clinical questions have been answered and PA submitted. PA currently Pending. Please be advised that most companies allow up to 30 days to make a decision. We will advise when a determination has been made, or follow up in 1 week.   Please reach out to our team, Rx Prior Auth Pool, if you haven't heard back in a week.

## 2023-09-05 NOTE — Telephone Encounter (Signed)
 Pharmacy Patient Advocate Encounter   Received notification from Patient Pharmacy that prior authorization for Ubrelvy  100mg  is required/requested.   Insurance verification completed.   The patient is insured through [facility] .   Per test claim: PA required; PA started via CoverMyMeds. KEY [id number] . Waiting for clinical questions to populate.

## 2023-09-08 ENCOUNTER — Other Ambulatory Visit: Payer: Self-pay | Admitting: Obstetrics and Gynecology

## 2023-09-08 DIAGNOSIS — R92333 Mammographic heterogeneous density, bilateral breasts: Secondary | ICD-10-CM

## 2023-10-12 ENCOUNTER — Telehealth: Payer: Self-pay | Admitting: Neurology

## 2023-10-12 NOTE — Telephone Encounter (Signed)
 LVM and sent mychart msg advising of 10/2 appt cancellation- MD out.

## 2023-10-12 NOTE — Telephone Encounter (Signed)
 Patient called to reschedule appointment with Dr. Ines. Informed patient of next available on 11/22/23. Patient stated need an appointment next week; will message Dr. Ines through MyChart to get an appointment.

## 2023-10-19 ENCOUNTER — Ambulatory Visit: Admitting: Neurology

## 2023-11-04 ENCOUNTER — Ambulatory Visit
Admission: RE | Admit: 2023-11-04 | Discharge: 2023-11-04 | Disposition: A | Source: Ambulatory Visit | Attending: Obstetrics and Gynecology | Admitting: Obstetrics and Gynecology

## 2023-11-04 DIAGNOSIS — R92333 Mammographic heterogeneous density, bilateral breasts: Secondary | ICD-10-CM

## 2023-11-04 MED ORDER — GADOPICLENOL 0.5 MMOL/ML IV SOLN
5.0000 mL | Freq: Once | INTRAVENOUS | Status: AC | PRN
  Administered 2023-11-04: 5 mL via INTRAVENOUS

## 2023-11-08 ENCOUNTER — Other Ambulatory Visit: Payer: Self-pay | Admitting: Obstetrics and Gynecology

## 2023-11-08 DIAGNOSIS — R9389 Abnormal findings on diagnostic imaging of other specified body structures: Secondary | ICD-10-CM

## 2023-11-22 ENCOUNTER — Ambulatory Visit
Admission: RE | Admit: 2023-11-22 | Discharge: 2023-11-22 | Disposition: A | Discharge status: Home / Self Care | Source: Ambulatory Visit | Attending: Obstetrics and Gynecology | Admitting: Obstetrics and Gynecology

## 2023-11-22 ENCOUNTER — Ambulatory Visit
Admission: RE | Admit: 2023-11-22 | Discharge: 2023-11-22 | Disposition: A | Source: Ambulatory Visit | Attending: Obstetrics and Gynecology | Admitting: Obstetrics and Gynecology

## 2023-11-22 DIAGNOSIS — R9389 Abnormal findings on diagnostic imaging of other specified body structures: Secondary | ICD-10-CM

## 2023-11-22 MED ORDER — GADOPICLENOL 0.5 MMOL/ML IV SOLN: 6.0000 mL | Freq: Once | INTRAVENOUS | Status: DC | PRN

## 2023-11-23 LAB — SURGICAL PATHOLOGY

## 2023-12-13 ENCOUNTER — Telehealth: Payer: Self-pay

## 2023-12-13 ENCOUNTER — Other Ambulatory Visit (HOSPITAL_COMMUNITY): Payer: Self-pay

## 2023-12-13 NOTE — Telephone Encounter (Signed)
 Hello Prescriber!  We are in the process of submitting a prior authorization for your patient for Ubrelvy . We are reaching out for clinical guidance for the following information to complete the request: Plan requiring documentation of clinical benefit since starting therapy.   Thank you! Pharmacy Team

## 2023-12-13 NOTE — Telephone Encounter (Signed)
 Patient asking if Botox  that had be schedule on 10/19/23 if had already paid for medication and if medication is still at your office. Patient stated will not be rescheduling appointment.

## 2024-01-10 NOTE — Telephone Encounter (Signed)
 Called patient; no answer, left voicemail to call the office in regards to cost of medication

## 2024-01-25 NOTE — Telephone Encounter (Signed)
 I called [facility] and spoke with Manuelita because we don't have a way to ship the medication to them. She states she could come next week to pick up the Botox  and transport it back to their office. I called the pt and LVM asking her to call me back and confirm this would be okay with her.

## 2024-01-30 NOTE — Telephone Encounter (Signed)
 Pt called me back and agreed to release medication to [facility] with [facility]. Manuelita will come tomorrow, 01/31/24, and pick up Botox . She states she will bring a box with a cooler to transport med.

## 2024-01-31 NOTE — Telephone Encounter (Signed)
 Botox  was released to Gina Cantrell with [facility]. She signed medication pickup sheet and transported Botox  in cooler with ice packs.
